# Patient Record
Sex: Female | Born: 1965 | Race: White | Hispanic: No | Marital: Married | Smoking: Never smoker
Health system: Southern US, Community
[De-identification: ages and names within clinical notes are randomized; demographics above are authoritative.]

## PROBLEM LIST (undated history)

## (undated) DIAGNOSIS — E785 Hyperlipidemia, unspecified: Secondary | ICD-10-CM

## (undated) DIAGNOSIS — M810 Age-related osteoporosis without current pathological fracture: Secondary | ICD-10-CM

---

## 1999-07-16 ENCOUNTER — Other Ambulatory Visit: Admission: RE | Admit: 1999-07-16 | Discharge: 1999-07-16 | Payer: Self-pay | Admitting: Obstetrics and Gynecology

## 2000-07-24 ENCOUNTER — Other Ambulatory Visit: Admission: RE | Admit: 2000-07-24 | Discharge: 2000-07-24 | Payer: Self-pay | Admitting: Obstetrics and Gynecology

## 2001-09-05 ENCOUNTER — Other Ambulatory Visit: Admission: RE | Admit: 2001-09-05 | Discharge: 2001-09-05 | Payer: Self-pay | Admitting: Obstetrics and Gynecology

## 2003-01-08 ENCOUNTER — Other Ambulatory Visit: Admission: RE | Admit: 2003-01-08 | Discharge: 2003-01-08 | Payer: Self-pay | Admitting: Obstetrics and Gynecology

## 2003-09-02 ENCOUNTER — Inpatient Hospital Stay (HOSPITAL_COMMUNITY): Admission: AD | Admit: 2003-09-02 | Discharge: 2003-09-04 | Payer: Self-pay | Admitting: Obstetrics and Gynecology

## 2003-09-05 ENCOUNTER — Encounter: Admission: RE | Admit: 2003-09-05 | Discharge: 2003-10-05 | Payer: Self-pay | Admitting: Obstetrics and Gynecology

## 2003-10-05 ENCOUNTER — Encounter: Admission: RE | Admit: 2003-10-05 | Discharge: 2003-11-04 | Payer: Self-pay | Admitting: Obstetrics and Gynecology

## 2003-10-06 ENCOUNTER — Other Ambulatory Visit: Admission: RE | Admit: 2003-10-06 | Discharge: 2003-10-06 | Payer: Self-pay | Admitting: Obstetrics and Gynecology

## 2005-08-10 ENCOUNTER — Other Ambulatory Visit: Admission: RE | Admit: 2005-08-10 | Discharge: 2005-08-10 | Payer: Self-pay | Admitting: Obstetrics and Gynecology

## 2006-03-28 ENCOUNTER — Other Ambulatory Visit: Admission: RE | Admit: 2006-03-28 | Discharge: 2006-03-28 | Payer: Self-pay | Admitting: Obstetrics and Gynecology

## 2009-01-12 LAB — CONVERTED CEMR LAB

## 2009-09-23 ENCOUNTER — Ambulatory Visit: Payer: Self-pay | Admitting: Family Medicine

## 2009-10-28 ENCOUNTER — Ambulatory Visit: Payer: Self-pay | Admitting: Family Medicine

## 2009-11-04 ENCOUNTER — Ambulatory Visit: Payer: Self-pay | Admitting: Family Medicine

## 2009-11-09 LAB — CONVERTED CEMR LAB
BUN: 15 mg/dL (ref 6–23)
CO2: 26 meq/L (ref 19–32)
Chloride: 105 meq/L (ref 96–112)
Cholesterol: 175 mg/dL (ref 0–200)
Creatinine, Ser: 0.8 mg/dL (ref 0.4–1.2)
Glucose, Bld: 93 mg/dL (ref 70–99)
LDL Cholesterol: 108 mg/dL — ABNORMAL HIGH (ref 0–99)
Potassium: 4.3 meq/L (ref 3.5–5.1)
Triglycerides: 39 mg/dL (ref 0.0–149.0)

## 2010-02-03 ENCOUNTER — Ambulatory Visit: Payer: Self-pay | Admitting: Family Medicine

## 2010-02-03 DIAGNOSIS — H60339 Swimmer's ear, unspecified ear: Secondary | ICD-10-CM

## 2010-11-22 ENCOUNTER — Ambulatory Visit: Payer: Self-pay | Admitting: Internal Medicine

## 2011-01-13 NOTE — Assessment & Plan Note (Signed)
Summary: ?SINUS INFECTION/CLE   Vital Signs:  Patient profile:   45 year old female Height:      63.25 inches Weight:      150.75 pounds BMI:     26.59 Temp:     98.3 degrees F oral Pulse rate:   64 / minute Pulse rhythm:   regular BP sitting:   102 / 60  (left arm) Cuff size:   regular  Vitals Entered By: Selena Batten Dance CMA Duncan Dull) (November 22, 2010 12:12 PM) CC: ? Sinus infection Comments Symptoms x1 week   History of Present Illness: CC: sinus infx?  2 wk h/o nose congestion, + HA with sinus pressure around eyes, ears clogged.  + sneezing.  Cough attributed to drainage.  feels PNDrip.  Has tried alleve, ibuprofen, mucinex, benadryl.  Has tried neti pot in AM which controlled sxs until this weekend, now not touching it.  Pressure worse with bending head forward.  + purulent nasal discharge.  No ST, RN, ear pain, tooth pain.  No abd pain, n/v/d, rashes, myalgias, arthralgias.  tends to get infections 2-3 x /year.   No sick contacts at home, no smokers at home.  no h/o asthma.  + h/o seasonal allergies usually in spring.  amoxicillin doesn't usually help with her sinus infections in past.   Current Medications (verified): 1)  Multivitamins   Tabs (Multiple Vitamin) .... About Once A Week  Allergies (verified): No Known Drug Allergies  Past History:  Past Medical History: Last updated: 09/23/2009 Cervical dysplasia  Social History: Last updated: 09/23/2009 Lives with husband and 3 children ( 6, 41, and 15 daughter named Surveyor, quantity) in [place].  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  Exercizes at least 4 days/week. Never Smoked Alcohol use-no Drug use-no  Review of Systems       per HPI  Physical Exam  General:  Well-developed,well-nourished,in no acute distress; alert,appropriate and cooperative throughout examination Head:  Normocephalic and atraumatic without obvious abnormalities. No apparent alopecia or balding. Eyes:  No corneal or conjunctival  inflammation noted. EOMI. Perrla.  Ears:  L ear normal and R ear normal.  slight more congestion L>R Nose:  nares injected. + discharge R turbinates Mouth:  Oral mucosa and oropharynx without lesions or exudates.  Teeth in good repair. MMM Neck:  No deformities, masses, or tenderness noted. Lungs:  Normal respiratory effort, chest expands symmetrically. Lungs are clear to auscultation, no crackles or wheezes. Heart:  Normal rate and regular rhythm. S1 and S2 normal without gallop, murmur, click, rub or other extra sounds. Pulses:  2+ rad pulses Extremities:  No clubbing, cyanosis, edema, or deformity noted with normal full range of motion of all joints.   Skin:  Intact without suspicious lesions or rashes   Impression & Recommendations:  Problem # 1:  SINUSITIS, ACUTE (ICD-461.9) Instructed on treatment. Call if symptoms persist or worsen.  see pt instructions.  pt to think about restarting intranasal steroid  Her updated medication list for this problem includes:    Augmentin 875-125 Mg Tabs (Amoxicillin-pot clavulanate) ..... One twice daily for 10 days  Complete Medication List: 1)  Multivitamins Tabs (Multiple vitamin) .... About once a week 2)  Augmentin 875-125 Mg Tabs (Amoxicillin-pot clavulanate) .... One twice daily for 10 days  Patient Instructions: 1)  You have a sinus infection. 2)  Take medicines as prescribed: augmentin twice daily for 10 days 3)  Take guaifenesin 400mg  IR 1 1/2 pills in am and at noon (or mucinex)  with plenty of fluid to help mobilize mucous. 4)  Use nasal saline spray or neti pot to help drainage of sinuses. 5)  If you start having fevers >101.5, trouble swallowing or breathing, or are worsening instead of improving as expected, you may need to be seen again. 6)  Good to see you today, call clinic with questions.  Prescriptions: AUGMENTIN 875-125 MG TABS (AMOXICILLIN-POT CLAVULANATE) one twice daily for 10 days  #20 x 0   Entered and Authorized by:    Eustaquio Boyden  MD   Signed by:   Eustaquio Boyden  MD on 11/22/2010   Method used:   [id number]       [address]       [place], [place]  [address]       Ph: [phone]       Fax: [phone]   RxID:   [id number]    Orders Added: 1)  Est. Patient Level III [41660]    Current Allergies (reviewed today): No known allergies

## 2011-01-13 NOTE — Assessment & Plan Note (Signed)
Summary: EAR/CLE   Vital Signs:  Patient profile:   45 year old female Height:      63.25 inches Weight:      147.50 pounds BMI:     26.02 Temp:     98.4 degrees F oral Pulse rate:   72 / minute Pulse rhythm:   regular BP sitting:   122 / 62  (left arm) Cuff size:   regular  Vitals Entered By: Delilah Shan CMA Duncan Dull) (February 03, 2010 3:01 PM) CC: Ear   History of Present Illness: 45 yo with 3 weeks or right ear irritation. Hurts a little and when she uses qtips, her qtip has been red. Sometimes a little itchy. No recent illnesses. Teaches water aerobics. Never had anything like this before.  Current Medications (verified): 1)  Multivitamins   Tabs (Multiple Vitamin) .... About Once A Week 2)  Ciprodex 0.3-0.1 % Susp (Ciprofloxacin-Dexamethasone) .... 4 Drops Into Affected Ear Twice Daily For 7 Days  Allergies (verified): No Known Drug Allergies  Review of Systems      See HPI General:  Denies chills and fever. ENT:  Complains of earache; denies ear discharge, nasal congestion, postnasal drainage, sinus pressure, and sore throat. Resp:  Denies cough.  Physical Exam  General:  Well-developed,well-nourished,in no acute distress; alert,appropriate and cooperative throughout examination Ears:  L ear normal and R canal inflamed.   Nose:  External nasal examination shows no deformity or inflammation. Nasal mucosa are pink and moist without lesions or exudates. Mouth:  Oral mucosa and oropharynx without lesions or exudates.  Teeth in good repair. Lungs:  Normal respiratory effort, chest expands symmetrically. Lungs are clear to auscultation, no crackles or wheezes. Heart:  Normal rate and regular rhythm. S1 and S2 normal without gallop, murmur, click, rub or other extra sounds. Psych:  normally interactive, good eye contact, not anxious appearing, and not depressed appearing.     Impression & Recommendations:  Problem # 1:  OTITIS EXTERNA, ACUTE  (ICD-380.12) Assessment New Ciprodex x 7 days. Advised to call office if no improvement in 5 days. Ibuprofen as needed for pain/inflammation. Her updated medication list for this problem includes:    Ciprodex 0.3-0.1 % Susp (Ciprofloxacin-dexamethasone) .Marland KitchenMarland KitchenMarland KitchenMarland Kitchen 4 drops into affected ear twice daily for 7 days  Complete Medication List: 1)  Multivitamins Tabs (Multiple vitamin) .... About once a week 2)  Ciprodex 0.3-0.1 % Susp (Ciprofloxacin-dexamethasone) .... 4 drops into affected ear twice daily for 7 days Prescriptions: CIPRODEX 0.3-0.1 % SUSP (CIPROFLOXACIN-DEXAMETHASONE) 4 drops into affected ear twice daily for 7 days  #1 x 0   Entered and Authorized by:   Ruthe Mannan MD   Signed by:   Ruthe Mannan MD on 02/03/2010   Method used:   [facility]        [phone][facility] (retail)       [address] [place], [place]  [address]       Ph: [phone] or [phone]       Fax: [phone]   RxID:   [id number]   Current Allergies (reviewed today): No known allergies

## 2018-03-15 ENCOUNTER — Telehealth: Payer: Self-pay

## 2018-03-15 ENCOUNTER — Other Ambulatory Visit: Payer: Self-pay

## 2018-03-15 DIAGNOSIS — Z1211 Encounter for screening for malignant neoplasm of colon: Secondary | ICD-10-CM

## 2018-03-15 NOTE — Telephone Encounter (Signed)
Gastroenterology Pre-Procedure Review  Request Date:  Requesting Physician: Dr.   PATIENT REVIEW QUESTIONS: The patient responded to the following health history questions as indicated:    1. Are you having any GI issues? no 2. Do you have a personal history of Polyps? no 3. Do you have a family history of Colon Cancer or Polyps? no 4. Diabetes Mellitus? no 5. Joint replacements in the past 12 months?no 6. Major health problems in the past 3 months?no 7. Any artificial heart valves, MVP, or defibrillator?no    MEDICATIONS & ALLERGIES:    Patient reports the following regarding taking any anticoagulation/antiplatelet therapy:   Plavix, Coumadin, Eliquis, Xarelto, Lovenox, Pradaxa, Brilinta, or Effient? no Aspirin? no  Patient confirms/reports the following medications:  No current outpatient medications on file.   No current facility-administered medications for this visit.     Patient confirms/reports the following allergies:  Allergies not on file  No orders of the defined types were placed in this encounter.   AUTHORIZATION INFORMATION Primary Insurance: 1D#: Group #:  Secondary Insurance: 1D#: Group #:  SCHEDULE INFORMATION: Date: 04/06/18 Time: Location: [facility]

## 2018-04-03 ENCOUNTER — Telehealth: Payer: Self-pay | Admitting: Gastroenterology

## 2018-04-03 NOTE — Telephone Encounter (Signed)
Pt left vm to cancel her procedure

## 2018-04-06 ENCOUNTER — Ambulatory Visit
Admission: RE | Admit: 2018-04-06 | Payer: BLUE CROSS/BLUE SHIELD | Source: Ambulatory Visit | Admitting: Gastroenterology

## 2018-04-06 ENCOUNTER — Encounter: Admission: RE | Payer: Self-pay | Source: Ambulatory Visit

## 2018-04-06 SURGERY — COLONOSCOPY WITH PROPOFOL
Anesthesia: General

## 2018-05-28 ENCOUNTER — Other Ambulatory Visit: Payer: Self-pay

## 2018-05-28 DIAGNOSIS — Z1211 Encounter for screening for malignant neoplasm of colon: Secondary | ICD-10-CM

## 2018-05-30 ENCOUNTER — Other Ambulatory Visit: Payer: Self-pay

## 2018-06-05 ENCOUNTER — Ambulatory Visit: Payer: BLUE CROSS/BLUE SHIELD | Admitting: Certified Registered Nurse Anesthetist

## 2018-06-05 ENCOUNTER — Encounter
Admission: RE | Disposition: A | Discharge status: Home / Self Care | Payer: Self-pay | Source: Ambulatory Visit | Attending: Gastroenterology

## 2018-06-05 ENCOUNTER — Encounter: Payer: Self-pay | Admitting: *Deleted

## 2018-06-05 ENCOUNTER — Ambulatory Visit
Admission: RE | Admit: 2018-06-05 | Discharge: 2018-06-05 | Disposition: A | Discharge status: Home / Self Care | Payer: BLUE CROSS/BLUE SHIELD | Source: Ambulatory Visit | Attending: Gastroenterology | Admitting: Gastroenterology

## 2018-06-05 DIAGNOSIS — K635 Polyp of colon: Secondary | ICD-10-CM | POA: Insufficient documentation

## 2018-06-05 DIAGNOSIS — Z1211 Encounter for screening for malignant neoplasm of colon: Secondary | ICD-10-CM | POA: Insufficient documentation

## 2018-06-05 HISTORY — PX: COLONOSCOPY WITH PROPOFOL: SHX5780

## 2018-06-05 SURGERY — COLONOSCOPY WITH PROPOFOL
Anesthesia: General

## 2018-06-05 MED ORDER — SODIUM CHLORIDE 0.9 % IV SOLN
INTRAVENOUS | Status: DC
  Administered 2018-06-05: 1000 mL via INTRAVENOUS

## 2018-06-05 MED ORDER — SODIUM CHLORIDE 0.9 % IV SOLN: INTRAVENOUS | Status: DC

## 2018-06-05 MED ORDER — PROPOFOL 10 MG/ML IV BOLUS
INTRAVENOUS | Status: DC | PRN
  Administered 2018-06-05: 50 mg via INTRAVENOUS

## 2018-06-05 MED ORDER — PHENYLEPHRINE HCL 10 MG/ML IJ SOLN
INTRAMUSCULAR | Status: DC | PRN
  Administered 2018-06-05: 100 ug via INTRAVENOUS

## 2018-06-05 MED ORDER — LIDOCAINE HCL (CARDIAC) PF 100 MG/5ML IV SOSY
PREFILLED_SYRINGE | INTRAVENOUS | Status: DC | PRN
  Administered 2018-06-05: 60 mg via INTRAVENOUS

## 2018-06-05 MED ORDER — GLYCOPYRROLATE 0.2 MG/ML IJ SOLN
INTRAMUSCULAR | Status: DC | PRN
  Administered 2018-06-05: 0.2 mg via INTRAVENOUS

## 2018-06-05 MED ORDER — PROPOFOL 500 MG/50ML IV EMUL
INTRAVENOUS | Status: DC | PRN
  Administered 2018-06-05: 125 ug/kg/min via INTRAVENOUS

## 2018-06-05 NOTE — Op Note (Signed)
[facility] Gastroenterology Patient Name: Sherry Bradshaw Procedure Date: 06/05/2018 1:49 PM MRN: [id number] Account #: [id number] Date of Birth: 1966-05-04 Admit Type: Outpatient Age: 52 Room: [facility] ENDO ROOM 4 Gender: Female Note Status: Finalized Procedure:            Colonoscopy Indications:          Screening for colorectal malignant neoplasm, This is                        the patient's first colonoscopy Providers:            Lin Landsman MD, MD Referring MD:         Monia Sabal. Corinna Capra MD, MD (Referring MD) Medicines:            Monitored Anesthesia Care Complications:        No immediate complications. Estimated blood loss: None. Procedure:            Pre-Anesthesia Assessment:                       - Prior to the procedure, a History and Physical was                        performed, and patient medications and allergies were                        reviewed. The patient is competent. The risks and                        benefits of the procedure and the sedation options and                        risks were discussed with the patient. All questions                        were answered and informed consent was obtained.                        Patient identification and proposed procedure were                        verified by the physician, the nurse, the                        anesthesiologist, the anesthetist and the technician in                        the pre-procedure area in the procedure room in the                        endoscopy suite. Mental Status Examination: alert and                        oriented. Airway Examination: normal oropharyngeal                        airway and neck mobility. Respiratory Examination:                        clear to auscultation. CV Examination: normal.  Prophylactic Antibiotics: The patient does not require                        prophylactic antibiotics. Prior Anticoagulants: The                 patient has taken no previous anticoagulant or                        antiplatelet agents. ASA Grade Assessment: I - A                        normal, healthy patient. After reviewing the risks and                        benefits, the patient was deemed in satisfactory                        condition to undergo the procedure. The anesthesia plan                        was to use monitored anesthesia care (MAC). Immediately                        prior to administration of medications, the patient was                        re-assessed for adequacy to receive sedatives. The                        heart rate, respiratory rate, oxygen saturations, blood                        pressure, adequacy of pulmonary ventilation, and                        response to care were monitored throughout the                        procedure. The physical status of the patient was                        re-assessed after the procedure.                       After obtaining informed consent, the colonoscope was                        passed under direct vision. Throughout the procedure,                        the patient's blood pressure, pulse, and oxygen                        saturations were monitored continuously. The                        Colonoscope was introduced through the anus and                        advanced to the the cecum, identified by appendiceal  orifice and ileocecal valve. The colonoscopy was                        performed without difficulty. The patient tolerated the                        procedure well. The quality of the bowel preparation                        was evaluated using the BBPS [facility] Bowel Preparation                        Scale) with scores of: Right Colon = 3, Transverse                        Colon = 3 and Left Colon = 3 (entire mucosa seen well                        with no residual staining, small fragments of stool or                         opaque liquid). The total BBPS score equals 9. Findings:      The perianal and digital rectal examinations were normal. Pertinent       negatives include normal sphincter tone and no palpable rectal lesions.      The colon (entire examined portion) appeared normal.      The retroflexed view of the distal rectum and anal verge was normal and       showed no anal or rectal abnormalities.      A diminutive polyp was found in the descending colon. The polyp was       sessile. The polyp was removed with a cold biopsy forceps. Resection and       retrieval were complete. Impression:           - The entire examined colon is normal.                       - The distal rectum and anal verge are normal on                        retroflexion view.                       - One diminutive polyp in the descending colon, removed                        with a cold biopsy forceps. Resected and retrieved. Recommendation:       - Discharge patient to home (with escort).                       - Resume regular diet today.                       - Continue present medications.                       - Await pathology results.                       -  Repeat colonoscopy in 5-10 years for surveillance                        based on pathology results. Procedure Code(s):    --- Professional ---                       [phone], Colonoscopy, flexible; with biopsy, single or                        multiple Diagnosis Code(s):    --- Professional ---                       Z12.11, Encounter for screening for malignant neoplasm                        of colon                       D12.4, Benign neoplasm of descending colon CPT copyright 2017 American Medical Association. All rights reserved. The codes documented in this report are preliminary and upon coder review may  be revised to meet current compliance requirements. Dr. Ulyess Mort Lin Landsman MD, MD 06/05/2018 2:06:55 PM This report has been  signed electronically. Number of Addenda: 0 Note Initiated On: 06/05/2018 1:49 PM Scope Withdrawal Time: 0 hours 7 minutes 3 seconds  Total Procedure Duration: 0 hours 9 minutes 50 seconds       [facility]

## 2018-06-05 NOTE — Anesthesia Post-op Follow-up Note (Signed)
Anesthesia QCDR form completed.        

## 2018-06-05 NOTE — Anesthesia Preprocedure Evaluation (Signed)
Anesthesia Evaluation  Patient identified by MRN, date of birth, ID band Patient awake    Reviewed: Allergy & Precautions, H&P , NPO status , Patient's Chart, lab work & pertinent test results  History of Anesthesia Complications Negative for: history of anesthetic complications  Airway Mallampati: III  TM Distance: >3 FB Neck ROM: full    Dental  (+) Chipped   Pulmonary neg pulmonary ROS, neg shortness of breath,           Cardiovascular Exercise Tolerance: Good (-) angina(-) Past MI and (-) DOE negative cardio ROS       Neuro/Psych negative neurological ROS  negative psych ROS   GI/Hepatic negative GI ROS, Neg liver ROS, neg GERD  ,  Endo/Other  negative endocrine ROS  Renal/GU negative Renal ROS  negative genitourinary   Musculoskeletal   Abdominal   Peds  Hematology negative hematology ROS (+)   Anesthesia Other Findings History reviewed. No pertinent past medical history.  History reviewed. No pertinent surgical history.  BMI    Body Mass Index:  27.46 kg/m      Reproductive/Obstetrics negative OB ROS                             Anesthesia Physical Anesthesia Plan  ASA: I  Anesthesia Plan: General   Post-op Pain Management:    Induction: Intravenous  PONV Risk Score and Plan: Propofol infusion and TIVA  Airway Management Planned: Natural Airway and Nasal Cannula  Additional Equipment:   Intra-op Plan:   Post-operative Plan:   Informed Consent: I have reviewed the patients History and Physical, chart, labs and discussed the procedure including the risks, benefits and alternatives for the proposed anesthesia with the patient or authorized representative who has indicated his/her understanding and acceptance.   Dental Advisory Given  Plan Discussed with: Anesthesiologist, CRNA and Surgeon  Anesthesia Plan Comments: (Patient consented for risks of anesthesia  including but not limited to:  - adverse reactions to medications - risk of intubation if required - damage to teeth, lips or other oral mucosa - sore throat or hoarseness - Damage to heart, brain, lungs or loss of life  Patient voiced understanding.)        Anesthesia Quick Evaluation

## 2018-06-05 NOTE — H&P (Signed)
  Arlyss Repressohini R Vanga, MD [address]  [address]  [place], [place] [address]  Main: [phone]  Fax: [phone] Pager: [phone]  Primary Care Physician:  Candice CampLowe, David, MD Primary Gastroenterologist:  Dr. Arlyss Repressohini R Vanga  Pre-Procedure History & Physical: HPI:  Sherry Bradshaw is a 52 y.o. female is here for an colonoscopy.   History reviewed. No pertinent past medical history.  History reviewed. No pertinent surgical history.  Prior to Admission medications   Not on File    Allergies as of 05/28/2018  . (Not on File)    History reviewed. No pertinent family history.  Social History   Socioeconomic History  . Marital status: Married    Spouse name: Not on file  . Number of children: Not on file  . Years of education: Not on file  . Highest education level: Not on file  Occupational History  . Not on file  Social Needs  . Financial resource strain: Not on file  . Food insecurity:    Worry: Not on file    Inability: Not on file  . Transportation needs:    Medical: Not on file    Non-medical: Not on file  Tobacco Use  . Smoking status: Never Smoker  . Smokeless tobacco: Never Used  Substance and Sexual Activity  . Alcohol use: Yes    Alcohol/week: 1.8 - 2.4 oz    Types: 3 - 4 Glasses of wine per week  . Drug use: Never  . Sexual activity: Not on file  Lifestyle  . Physical activity:    Days per week: Not on file    Minutes per session: Not on file  . Stress: Not on file  Relationships  . Social connections:    Talks on phone: Not on file    Gets together: Not on file    Attends religious service: Not on file    Active member of club or organization: Not on file    Attends meetings of clubs or organizations: Not on file    Relationship status: Not on file  . Intimate partner violence:    Fear of current or ex partner: Not on file    Emotionally abused: Not on file    Physically abused: Not on file    Forced sexual activity: Not on file    Other Topics Concern  . Not on file  Social History Narrative  . Not on file    Review of Systems: See HPI, otherwise negative ROS  Physical Exam: BP 117/80   Pulse 72   Temp (!) 96.7 F (35.9 C) (Tympanic)   Resp 18   Ht 5\' 4"  (1.626 m)   Wt 160 lb (72.6 kg)   SpO2 100%   BMI 27.46 kg/m  General:   Alert,  pleasant and cooperative in NAD Head:  Normocephalic and atraumatic. Neck:  Supple; no masses or thyromegaly. Lungs:  Clear throughout to auscultation.    Heart:  Regular rate and rhythm. Abdomen:  Soft, nontender and nondistended. Normal bowel sounds, without guarding, and without rebound.   Neurologic:  Alert and  oriented x4;  grossly normal neurologically.  Impression/Plan: Jullisa K Heng is here for an colonoscopy to be performed for colon cancer screening  Risks, benefits, limitations, and alternatives regarding  colonoscopy have been reviewed with the patient.  Questions have been answered.  All parties agreeable.   Lannette Donathohini Vanga, MD  06/05/2018, 1:36 PM

## 2018-06-05 NOTE — Transfer of Care (Signed)
Immediate Anesthesia Transfer of Care Note  Patient: Sherry Bradshaw  Procedure(s) Performed: COLONOSCOPY WITH PROPOFOL (N/A )  Patient Location: PACU and Endoscopy Unit  Anesthesia Type:General  Level of Consciousness: awake and drowsy  Airway & Oxygen Therapy: Patient Spontanous Breathing  Post-op Assessment: Report given to RN and Post -op Vital signs reviewed and stable  Post vital signs: Reviewed and stable  Last Vitals:  Vitals Value Taken Time  BP 107/93   Temp 37.1   Pulse 64   Resp 12   SpO2 96     Last Pain:  Vitals:   06/05/18 1328  TempSrc: Tympanic      Patients Stated Pain Goal: 0 (06/05/18 1328)  Complications: No apparent anesthesia complications

## 2018-06-06 NOTE — Anesthesia Postprocedure Evaluation (Signed)
Anesthesia Post Note  Patient: Sherry Bradshaw  Procedure(s) Performed: COLONOSCOPY WITH PROPOFOL (N/A )  Patient location during evaluation: Endoscopy Anesthesia Type: General Level of consciousness: awake and alert Pain management: pain level controlled Vital Signs Assessment: post-procedure vital signs reviewed and stable Respiratory status: spontaneous breathing, nonlabored ventilation, respiratory function stable and patient connected to nasal cannula oxygen Cardiovascular status: blood pressure returned to baseline and stable Postop Assessment: no apparent nausea or vomiting Anesthetic complications: no     Last Vitals:  Vitals:   06/05/18 1429 06/05/18 1435  BP: 102/69   Pulse: (!) 59 (!) 48  Resp: 13 16  Temp:    SpO2: 100% 100%    Last Pain:  Vitals:   06/06/18 0738  TempSrc:   PainSc: 0-No pain                 Cleda MccreedyJoseph K Hanifah Royse

## 2018-06-07 LAB — SURGICAL PATHOLOGY

## 2018-06-08 ENCOUNTER — Encounter: Payer: Self-pay | Admitting: Gastroenterology

## 2020-09-03 ENCOUNTER — Other Ambulatory Visit: Payer: BLUE CROSS/BLUE SHIELD

## 2020-09-03 DIAGNOSIS — Z20822 Contact with and (suspected) exposure to covid-19: Secondary | ICD-10-CM

## 2020-09-05 LAB — SARS-COV-2, NAA 2 DAY TAT

## 2020-09-05 LAB — NOVEL CORONAVIRUS, NAA: SARS-CoV-2, NAA: DETECTED — AB

## 2020-12-15 HISTORY — PX: TRANSTHORACIC ECHOCARDIOGRAM: SHX275

## 2021-12-12 HISTORY — PX: OTHER SURGICAL HISTORY: SHX169

## 2021-12-13 ENCOUNTER — Emergency Department: Payer: BC Managed Care – PPO

## 2021-12-13 ENCOUNTER — Encounter: Payer: Self-pay | Admitting: Emergency Medicine

## 2021-12-13 ENCOUNTER — Other Ambulatory Visit: Payer: Self-pay

## 2021-12-13 DIAGNOSIS — R079 Chest pain, unspecified: Secondary | ICD-10-CM | POA: Diagnosis not present

## 2021-12-13 DIAGNOSIS — R0989 Other specified symptoms and signs involving the circulatory and respiratory systems: Secondary | ICD-10-CM | POA: Insufficient documentation

## 2021-12-13 DIAGNOSIS — Z20822 Contact with and (suspected) exposure to covid-19: Secondary | ICD-10-CM | POA: Insufficient documentation

## 2021-12-13 DIAGNOSIS — R55 Syncope and collapse: Secondary | ICD-10-CM | POA: Insufficient documentation

## 2021-12-13 LAB — COMPREHENSIVE METABOLIC PANEL
ALT: 20 U/L (ref 0–44)
AST: 24 U/L (ref 15–41)
Albumin: 4.4 g/dL (ref 3.5–5.0)
Alkaline Phosphatase: 58 U/L (ref 38–126)
Anion gap: 8 (ref 5–15)
BUN: 13 mg/dL (ref 6–20)
CO2: 26 mmol/L (ref 22–32)
Calcium: 9.2 mg/dL (ref 8.9–10.3)
Chloride: 103 mmol/L (ref 98–111)
Creatinine, Ser: 0.75 mg/dL (ref 0.44–1.00)
GFR, Estimated: >60 mL/min (ref >60)
Glucose, Bld: 100 mg/dL — ABNORMAL HIGH (ref 70–99)
Potassium: 3.5 mmol/L (ref 3.5–5.1)
Sodium: 137 mmol/L (ref 135–145)
Total Bilirubin: 1 mg/dL (ref 0.3–1.2)
Total Protein: 6.9 g/dL (ref 6.5–8.1)

## 2021-12-13 LAB — CBC WITH DIFFERENTIAL/PLATELET
Abs Immature Granulocytes: 0.02 10*3/uL (ref 0.00–0.07)
Basophils Absolute: 0 10*3/uL (ref 0.0–0.1)
Basophils Relative: 1 %
Eosinophils Absolute: 0.1 10*3/uL (ref 0.0–0.5)
Eosinophils Relative: 1 %
HCT: 36.9 % (ref 36.0–46.0)
Hemoglobin: 12.6 g/dL (ref 12.0–15.0)
Immature Granulocytes: 0 %
Lymphocytes Relative: 16 %
Lymphs Abs: 1 10*3/uL (ref 0.7–4.0)
MCH: 31.3 pg (ref 26.0–34.0)
MCHC: 34.1 g/dL (ref 30.0–36.0)
MCV: 91.6 fL (ref 80.0–100.0)
Monocytes Absolute: 0.5 10*3/uL (ref 0.1–1.0)
Monocytes Relative: 7 %
Neutro Abs: 4.8 10*3/uL (ref 1.7–7.7)
Neutrophils Relative %: 75 %
Platelets: 233 10*3/uL (ref 150–400)
RBC: 4.03 MIL/uL (ref 3.87–5.11)
RDW: 12.3 % (ref 11.5–15.5)
WBC: 6.3 10*3/uL (ref 4.0–10.5)
nRBC: 0 % (ref 0.0–0.2)

## 2021-12-13 LAB — TROPONIN I (HIGH SENSITIVITY)
Troponin I (High Sensitivity): 3 ng/L (ref <18)
Troponin I (High Sensitivity): <2 ng/L (ref <18)

## 2021-12-13 NOTE — ED Provider Notes (Signed)
°  Emergency Medicine Provider Triage Evaluation Note  Sherry Bradshaw , a 56 y.o.female,  was evaluated in triage.  Pt complains of chest pain.  This is been going on intermittently for the past week.  Described as heaviness in the central part of her chest, with associated numbness in her hands and feet.  Previously seen by her primary care provider who did an EKG which was unremarkable.  She has an echo scheduled for next week.  No other symptoms at this time   Review of Systems  Positive: Chest pain Negative: Denies fever, abdominal pain vomiting  Physical Exam   Vitals:   12/13/21 1530  BP: 137/72  Pulse: 77  Resp: 20  Temp: 98 F (36.7 C)  SpO2: 98%   Gen:   Awake, no distress   Resp:  Normal effort  MSK:   Moves extremities without difficulty  Other:    Medical Decision Making  Given the patient's initial medical screening exam, the following diagnostic evaluation has been ordered. The patient will be placed in the appropriate treatment space, once one is available, to complete the evaluation and treatment. I have discussed the plan of care with the patient and I have advised the patient that an ED physician or mid-level practitioner will reevaluate their condition after the test results have been received, as the results may give them additional insight into the type of treatment they may need.    Diagnostics: Labs, EKG, CXR  Treatments: none immediately   Varney Daily, [place] 12/13/21 1533    Shaune Pollack, MD 12/13/21 1721

## 2021-12-13 NOTE — ED Triage Notes (Signed)
Pt via POV from home. Pt c/o dizziness, unbalanced and chest pressure states that it started today and happened also last week. Pt states that this started today. Pt is A&OX4 and NAD.

## 2021-12-14 ENCOUNTER — Observation Stay
Admission: EM | Admit: 2021-12-14 | Discharge: 2021-12-15 | Disposition: A | Discharge status: Home / Self Care | Payer: BC Managed Care – PPO | Attending: Emergency Medicine | Admitting: Emergency Medicine

## 2021-12-14 ENCOUNTER — Encounter: Payer: Self-pay | Admitting: Internal Medicine

## 2021-12-14 ENCOUNTER — Observation Stay: Payer: BC Managed Care – PPO

## 2021-12-14 DIAGNOSIS — R079 Chest pain, unspecified: Secondary | ICD-10-CM

## 2021-12-14 DIAGNOSIS — R55 Syncope and collapse: Secondary | ICD-10-CM

## 2021-12-14 DIAGNOSIS — R200 Anesthesia of skin: Secondary | ICD-10-CM

## 2021-12-14 DIAGNOSIS — E785 Hyperlipidemia, unspecified: Secondary | ICD-10-CM | POA: Diagnosis present

## 2021-12-14 DIAGNOSIS — R0789 Other chest pain: Secondary | ICD-10-CM

## 2021-12-14 DIAGNOSIS — R0989 Other specified symptoms and signs involving the circulatory and respiratory systems: Secondary | ICD-10-CM

## 2021-12-14 HISTORY — DX: Age-related osteoporosis without current pathological fracture: M81.0

## 2021-12-14 HISTORY — DX: Hyperlipidemia, unspecified: E78.5

## 2021-12-14 LAB — TSH: TSH: 1.073 u[IU]/mL (ref 0.350–4.500)

## 2021-12-14 LAB — RESP PANEL BY RT-PCR (FLU A&B, COVID) ARPGX2
Influenza A by PCR: NEGATIVE
Influenza B by PCR: NEGATIVE
SARS Coronavirus 2 by RT PCR: NEGATIVE

## 2021-12-14 LAB — HEMOGLOBIN A1C
Hgb A1c MFr Bld: 5 % (ref 4.8–5.6)
Mean Plasma Glucose: 96.8 mg/dL

## 2021-12-14 LAB — D-DIMER, QUANTITATIVE: D-Dimer, Quant: <0.27 ug/mL-FEU (ref 0.00–0.50)

## 2021-12-14 LAB — T4, FREE: Free T4: 1 ng/dL (ref 0.61–1.12)

## 2021-12-14 MED ORDER — NITROGLYCERIN 0.4 MG SL SUBL: 0.4000 mg | SUBLINGUAL_TABLET | SUBLINGUAL | Status: DC | PRN

## 2021-12-14 MED ORDER — ONDANSETRON HCL 4 MG/2ML IJ SOLN: 4.0000 mg | Freq: Three times a day (TID) | INTRAMUSCULAR | Status: DC | PRN

## 2021-12-14 MED ORDER — ACETAMINOPHEN 325 MG PO TABS
650.0000 mg | ORAL_TABLET | Freq: Once | ORAL | Status: AC
Start: 2021-12-14 — End: 2021-12-14
  Administered 2021-12-14: 650 mg via ORAL
  Filled 2021-12-14: qty 2

## 2021-12-14 MED ORDER — ACETAMINOPHEN 325 MG PO TABS
650.0000 mg | ORAL_TABLET | Freq: Four times a day (QID) | ORAL | Status: DC | PRN
  Administered 2021-12-14 – 2021-12-15 (×2): 650 mg via ORAL
  Filled 2021-12-14 (×2): qty 2

## 2021-12-14 MED ORDER — NITROGLYCERIN 2 % TD OINT
0.5000 [in_us] | TOPICAL_OINTMENT | Freq: Once | TRANSDERMAL | Status: AC
  Administered 2021-12-14: 0.5 [in_us] via TOPICAL
  Filled 2021-12-14: qty 1

## 2021-12-14 MED ORDER — ASPIRIN 81 MG PO CHEW
324.0000 mg | CHEWABLE_TABLET | Freq: Once | ORAL | Status: AC
  Administered 2021-12-14: 324 mg via ORAL
  Filled 2021-12-14: qty 4

## 2021-12-14 MED ORDER — SODIUM CHLORIDE 0.9 % IV BOLUS
1000.0000 mL | Freq: Once | INTRAVENOUS | Status: AC
  Administered 2021-12-14: 1000 mL via INTRAVENOUS

## 2021-12-14 MED ORDER — ENOXAPARIN SODIUM 40 MG/0.4ML IJ SOSY
40.0000 mg | PREFILLED_SYRINGE | INTRAMUSCULAR | Status: DC
  Administered 2021-12-14: 40 mg via SUBCUTANEOUS
  Filled 2021-12-14: qty 0.4

## 2021-12-14 MED ORDER — ASPIRIN EC 81 MG PO TBEC
81.0000 mg | DELAYED_RELEASE_TABLET | Freq: Every day | ORAL | Status: DC
  Administered 2021-12-15: 10:00:00 81 mg via ORAL
  Filled 2021-12-14: qty 1

## 2021-12-14 NOTE — H&P (Addendum)
History and Physical    Nolynn Benac Slagel [id number] DOB: May 24, 1966 DOA: 12/14/2021  Referring MD/NP/PA:   PCP: Louretta Shorten, MD   Patient coming from:  The patient is coming from home.  At baseline, pt is independent for most of ADL.        Chief Complaint: chest pressure and near syncope  HPI: Sherry Bradshaw is a 56 y.o. female with medical history significant of HLD, osteoporosis, who presents with chest pressure and near syncope.  Patient states that she feels dizzy like she was unbalanced since last night.  She feels like she is going to pass out, but did not.  She also had a 1 episode of numbness in the right arm and right leg yesterday, which has resolved.  No unilateral numbness or tinglings in extremities, no facial droop or slurred speech.  She also has chest pressure, which is located in substernal area, mild to moderate, nonradiating.  No shortness of breath.  No cough, fever or chills.  Patient does not have nausea, vomiting, diarrhea or abdominal pain.  No symptoms of UTI. She state that she wears an apple watch and has been noted her heart rate fluctuating from 50s to 130s.  She states that she had similar symptoms last week, which has resolved spontaneously. Patient told her PCP and is scheduled for echocardiogram.    ED Course: pt was found to have WBC 6.3, troponin level 3, -->  <2.0, negative D-dimer, TSH normal 1.073, negative COVID PCR, electrolytes renal function okay, temperature normal, blood pressure 124/75, heart rate 77, RR 20, oxygen saturation 98% on room air.  Chest x-ray negative.  CT of head is negative for acute intracranial abnormalities.  Carotid Doppler is negative for significant carotid artery stenosis. Patient is placed on MedSurg bed for observation.   Review of Systems:   General: no fevers, chills, no body weight gain, fatigue HEENT: no blurry vision, hearing changes or sore throat Respiratory: no dyspnea, coughing, wheezing CV: has chest  pressure, no palpitations GI: no nausea, vomiting, abdominal pain, diarrhea, constipation GU: no dysuria, burning on urination, increased urinary frequency, hematuria  Ext: has trace leg edema Neuro: had right arm and leg numbness, no vision change or hearing loss Skin: no rash, no skin tear. MSK: No muscle spasm, no deformity, no limitation of range of movement in spin Heme: No easy bruising.  Travel history: No recent long distant travel.  Allergy: No Known Allergies  Past Medical History:  Diagnosis Date   HLD (hyperlipidemia)    Osteoporosis     Past Surgical History:  Procedure Laterality Date   COLONOSCOPY WITH PROPOFOL N/A 06/05/2018   Procedure: COLONOSCOPY WITH PROPOFOL;  Surgeon: Lin Landsman, MD;  Location: [facility] ENDOSCOPY;  Service: Gastroenterology;  Laterality: N/A;    Social History:  reports that she has never smoked. She has never used smokeless tobacco. She reports current alcohol use of about 3.0 - 4.0 standard drinks per week. She reports that she does not use drugs.  Family History:  Family History  Problem Relation Age of Onset   Valvular heart disease Mother    Heart disease Maternal Grandmother      Prior to Admission medications   Not on File    Physical Exam: Vitals:   12/13/21 1934 12/13/21 2249 12/14/21 0450 12/14/21 0942  BP: 128/79 128/82 124/75 124/70  Pulse: 71 66 70 78  Resp: 18 18 16 16   Temp: 98.1 F (36.7 C) 98.1 F (36.7 C)  TempSrc: Oral Oral    SpO2: 100% 98% 100% 100%  Weight:      Height:       General: Not in acute distress HEENT:       Eyes: PERRL, EOMI, no scleral icterus.       ENT: No discharge from the ears and nose, no pharynx injection, no tonsillar enlargement.        Neck: No JVD, no bruit, no mass felt. Heme: No neck lymph node enlargement. Cardiac: S1/S2, RRR, No murmurs, No gallops or rubs. Respiratory: No rales, wheezing, rhonchi or rubs. GI: Soft, nondistended, nontender, no rebound pain, no  organomegaly, BS present. GU: No hematuria Ext: has trace leg edema bilaterally. 1+DP/PT pulse bilaterally. Musculoskeletal: No joint deformities, No joint redness or warmth, no limitation of ROM in spin. Skin: No rashes.  Neuro: Alert, oriented X3, cranial nerves II-XII grossly intact, moves all extremities normally. Psych: Patient is not psychotic, no suicidal or hemocidal ideation.  Labs on Admission: I have personally reviewed following labs and imaging studies  CBC: Recent Labs  Lab 12/13/21 1529  WBC 6.3  NEUTROABS 4.8  HGB 12.6  HCT 36.9  MCV 91.6  PLT 0000000   Basic Metabolic Panel: Recent Labs  Lab 12/13/21 1529  NA 137  K 3.5  CL 103  CO2 26  GLUCOSE 100*  BUN 13  CREATININE 0.75  CALCIUM 9.2   GFR: Estimated Creatinine Clearance: 75.9 mL/min (by C-G formula based on SCr of 0.75 mg/dL). Liver Function Tests: Recent Labs  Lab 12/13/21 1529  AST 24  ALT 20  ALKPHOS 58  BILITOT 1.0  PROT 6.9  ALBUMIN 4.4   No results for input(s): LIPASE, AMYLASE in the last 168 hours. No results for input(s): AMMONIA in the last 168 hours. Coagulation Profile: No results for input(s): INR, PROTIME in the last 168 hours. Cardiac Enzymes: No results for input(s): CKTOTAL, CKMB, CKMBINDEX, TROPONINI in the last 168 hours. BNP (last 3 results) No results for input(s): PROBNP in the last 8760 hours. HbA1C: Recent Labs    12/13/21 1529  HGBA1C 5.0   CBG: No results for input(s): GLUCAP in the last 168 hours. Lipid Profile: No results for input(s): CHOL, HDL, LDLCALC, TRIG, CHOLHDL, LDLDIRECT in the last 72 hours. Thyroid Function Tests: Recent Labs    12/13/21 1816  TSH 1.073  FREET4 1.00   Anemia Panel: No results for input(s): VITAMINB12, FOLATE, FERRITIN, TIBC, IRON, RETICCTPCT in the last 72 hours. Urine analysis: No results found for: COLORURINE, APPEARANCEUR, LABSPEC, PHURINE, GLUCOSEU, HGBUR, BILIRUBINUR, KETONESUR, PROTEINUR, UROBILINOGEN, NITRITE,  LEUKOCYTESUR Sepsis Labs: @LABRCNTIP (procalcitonin:4,lacticidven:4) ) Recent Results (from the past 240 hour(s))  Resp Panel by RT-PCR (Flu A&B, Covid) Nasopharyngeal Swab     Status: None   Collection Time: 12/14/21  6:18 AM   Specimen: Nasopharyngeal Swab; Nasopharyngeal(NP) swabs in vial transport medium  Result Value Ref Range Status   SARS Coronavirus 2 by RT PCR NEGATIVE NEGATIVE Final    Comment: (NOTE) SARS-CoV-2 target nucleic acids are NOT DETECTED.  The SARS-CoV-2 RNA is generally detectable in upper respiratory specimens during the acute phase of infection. The lowest concentration of SARS-CoV-2 viral copies this assay can detect is 138 copies/mL. A negative result does not preclude SARS-Cov-2 infection and should not be used as the sole basis for treatment or other patient management decisions. A negative result may occur with  improper specimen collection/handling, submission of specimen other than nasopharyngeal swab, presence of viral mutation(s) within the areas targeted by  this assay, and inadequate number of viral copies(<138 copies/mL). A negative result must be combined with clinical observations, patient history, and epidemiological information. The expected result is Negative.  Fact Sheet for Patients:  EntrepreneurPulse.com.au  Fact Sheet for Healthcare Providers:  IncredibleEmployment.be  This test is no t yet approved or cleared by the [place] FDA and  has been authorized for detection and/or diagnosis of SARS-CoV-2 by FDA under an Emergency Use Authorization (EUA). This EUA will remain  in effect (meaning this test can be used) for the duration of the COVID-19 declaration under Section 564(b)(1) of the Act, 21 U.S.C.section 360bbb-3(b)(1), unless the authorization is terminated  or revoked sooner.       Influenza A by PCR NEGATIVE NEGATIVE Final   Influenza B by PCR NEGATIVE NEGATIVE Final    Comment:  (NOTE) The Xpert Xpress SARS-CoV-2/FLU/RSV plus assay is intended as an aid in the diagnosis of influenza from Nasopharyngeal swab specimens and should not be used as a sole basis for treatment. Nasal washings and aspirates are unacceptable for Xpert Xpress SARS-CoV-2/FLU/RSV testing.  Fact Sheet for Patients: EntrepreneurPulse.com.au  Fact Sheet for Healthcare Providers: IncredibleEmployment.be  This test is not yet approved or cleared by the [place] FDA and has been authorized for detection and/or diagnosis of SARS-CoV-2 by FDA under an Emergency Use Authorization (EUA). This EUA will remain in effect (meaning this test can be used) for the duration of the COVID-19 declaration under Section 564(b)(1) of the Act, 21 U.S.C. section 360bbb-3(b)(1), unless the authorization is terminated or revoked.  Performed at [facility], [address], [place], [place] [address]      Radiological Exams on Admission: DG Chest 1 View  Result Date: 12/13/2021 CLINICAL DATA:  Chest pain EXAM: CHEST  1 VIEW COMPARISON:  None. FINDINGS: The heart size and mediastinal contours are within normal limits. Both lungs are clear. The visualized skeletal structures are unremarkable. IMPRESSION: No active disease. Electronically Signed   By: Keane Police D.O.   On: 12/13/2021 16:18   CT Head Wo Contrast  Result Date: 12/14/2021 CLINICAL DATA:  Chest pain and dizziness. EXAM: CT HEAD WITHOUT CONTRAST TECHNIQUE: Contiguous axial images were obtained from the base of the skull through the vertex without intravenous contrast. COMPARISON:  None. FINDINGS: Brain: No evidence of acute infarction, hemorrhage, hydrocephalus, extra-axial collection or mass lesion/mass effect. Vascular: No hyperdense vessel or unexpected calcification. Skull: Normal. Negative for fracture or focal lesion. Sinuses/Orbits: No acute finding. Other: None. IMPRESSION: No acute intracranial  pathology. Electronically Signed   By: Virgina Norfolk M.D.   On: 12/14/2021 00:00   US Carotid Bilateral  Result Date: 12/14/2021 CLINICAL DATA:  55 year old female with history of syncope EXAM: BILATERAL CAROTID DUPLEX ULTRASOUND TECHNIQUE: Pearline Cables scale imaging, color Doppler and duplex ultrasound were performed of bilateral carotid and vertebral arteries in the neck. COMPARISON:  None. FINDINGS: Criteria: Quantification of carotid stenosis is based on velocity parameters that correlate the residual internal carotid diameter with NASCET-based stenosis levels, using the diameter of the distal internal carotid lumen as the denominator for stenosis measurement. The following velocity measurements were obtained: RIGHT ICA:  Systolic A999333 cm/sec, Diastolic 30 cm/sec CCA:  123456 cm/sec SYSTOLIC ICA/CCA RATIO:  1.2 ECA:  174 cm/sec LEFT ICA:  Systolic XX123456 cm/sec, Diastolic 21 cm/sec CCA:  123XX123 cm/sec SYSTOLIC ICA/CCA RATIO:  1.1 ECA:  93 cm/sec Right Brachial SBP: Not acquired Left Brachial SBP: Not acquired RIGHT CAROTID ARTERY: No significant calcified disease of the right  common carotid artery. Intermediate waveform maintained. Homogeneous plaque without significant calcifications at the right carotid bifurcation. Low resistance waveform of the right ICA. No significant tortuosity. RIGHT VERTEBRAL ARTERY: Antegrade flow with low resistance waveform. LEFT CAROTID ARTERY: No significant calcified disease of the left common carotid artery. Intermediate waveform maintained. Homogeneous plaque at the left carotid bifurcation without significant calcifications. Low resistance waveform of the left ICA. LEFT VERTEBRAL ARTERY:  Antegrade flow with low resistance waveform. IMPRESSION: Color duplex indicates minimal homogeneous plaque, with no hemodynamically significant stenosis by duplex criteria in the extracranial cerebrovascular circulation. Signed, Dulcy Fanny. Dellia Nims, RPVI [facility]  [facility] Electronically Signed   By: Corrie Mckusick D.O.   On: 12/14/2021 12:11     EKG: I have personally reviewed.  Sinus rhythm, QTC 475, RAD, right bundle blockade, early R wave progression  Assessment/Plan Principal Problem:   Near syncope Active Problems:   Chest pressure   HLD (hyperlipidemia)   Near syncope: Etiology is not clear.  Carotid Doppler negative.  Patient reports 1 episode of right arm and leg numbness.  We need to rule out stroke.  Patient reports heart rate 50-130 at home, but her heart rate is 77 which is normal currently.  EKG showed right bundle blockage with right axis deviation.  Will get 2D echo for further evaluation.   - Place on progressive unit for obs - Orthostatic vital signs  - MRI-brain - 2d echo - Neuro checks  - cardiac monitoring - IVF: 1L of NS  Chest pressure: Currently symptoms has resolved.  Initial troponin negative x2 -Get one more trop -ASA 324 mg, then 81 mg daily -check A1c, FLP -check UDS -f/u 2d echo -prn NTG  HLD (hyperlipidemia): Patient used to be on Zetia, but currently is not taking his medication. -f/u with PCP     DVT ppx: SQ Lovenox Code Status: Full code Family Communication:  Yes, patient's husband at bed side.   Disposition Plan:  Anticipate discharge back to previous environment Consults called:  none Admission status and Level of care: Telemetry Medical:  for obs     Status is: Observation  The patient remains OBS appropriate and will d/c before 2 midnights.         Date of Service 12/14/2021    Ivor Costa [facility]   If 7PM-7AM, please contact night-coverage [email or web address] 12/14/2021, 1:22 PM

## 2021-12-14 NOTE — ED Notes (Signed)
Sitting on side of bed  denies nay pain at present  monitor shows NS

## 2021-12-14 NOTE — ED Notes (Signed)
Pt leaving for imaging.

## 2021-12-14 NOTE — ED Provider Notes (Signed)
[facility] : [facility] Provider Note    Event Date/Time   First MD Initiated Contact with Patient 12/14/21 [phone]     (approximate)   History   Chest Pain   HPI  Sherry Bradshaw is a 56 y.o. female who presents to the ED from home with a chief complaint of chest pain and dizziness.  Patient denies cardiac history.  Reports 1 episode last week which she describes as chest heaviness, feeling dizzy like she was unbalanced and about to pass out.  Episode resolved by itself.  Patient told her PCP and is scheduled for echocardiogram.  Last night she was unable to sleep around 1 AM secondary to recurrence of central chest heaviness.  Denies associated diaphoresis, shortness of breath, nausea/vomiting.  She wears an apple watch and has been noted her heart rate fluctuating from 50s to 130s.  Patient's mother with a history of atrial fibrillation.  Chest heaviness also associated with near syncopal episode.  Patient denies recent trauma, travel or hormone use.  Denies fever, cough, abdominal pain, dysuria or diarrhea.     Past Medical History   Past Medical History:  Diagnosis Date   Osteoporosis      Active Problem List   Patient Active Problem List   Diagnosis Date Noted   Near syncope 12/14/2021   Encounter for screening colonoscopy    OTITIS EXTERNA, ACUTE 02/03/2010     Past Surgical History   Past Surgical History:  Procedure Laterality Date   COLONOSCOPY WITH PROPOFOL N/A 06/05/2018   Procedure: COLONOSCOPY WITH PROPOFOL;  Surgeon: Lin Landsman, MD;  Location: [facility] ENDOSCOPY;  Service: Gastroenterology;  Laterality: N/A;     Home Medications   Prior to Admission medications   Not on File     Allergies  Patient has no known allergies.   Family History  No family history on file.   Physical Exam  Triage Vital Signs: ED Triage Vitals  Enc Vitals Group     BP 12/13/21 1530 137/72     Pulse Rate 12/13/21 1530 77     Resp 12/13/21 1530  20     Temp 12/13/21 1530 98 F (36.7 C)     Temp Source 12/13/21 1530 Oral     SpO2 12/13/21 1530 98 %     Weight 12/13/21 1531 160 lb (72.6 kg)     Height 12/13/21 1531 5\' 3"  (1.6 m)     Head Circumference --      Peak Flow --      Pain Score 12/13/21 1530 3     Pain Loc --      Pain Edu? --      Excl. in [place]? --     Updated Vital Signs: BP 124/75 (BP Location: Left Arm)    Pulse 70    Temp 98.1 F (36.7 C) (Oral)    Resp 16    Ht 5\' 3"  (1.6 m)    Wt 72.6 kg    SpO2 100%    BMI 28.34 kg/m    General: Awake, no distress.  CV:  Good peripheral perfusion.  Resp:  Normal effort.  Abd:  No distention.  Neuro:  Alert and oriented x3.  CN II to XII grossly intact.  No focal motor or sensory deficits.  MAE x4. Other:  + Right carotid bruit.    ED Results / Procedures / Treatments  Labs (all labs ordered are listed, but only abnormal results are displayed) Labs Reviewed  COMPREHENSIVE  METABOLIC PANEL - Abnormal; Notable for the following components:      Result Value   Glucose, Bld 100 (*)    All other components within normal limits  RESP PANEL BY RT-PCR (FLU A&B, COVID) ARPGX2  CBC WITH DIFFERENTIAL/PLATELET  TSH  T4, FREE  TROPONIN I (HIGH SENSITIVITY)  TROPONIN I (HIGH SENSITIVITY)     EKG  ED ECG REPORT I, Keyonia Gluth J, the attending physician, personally viewed and interpreted this ECG.   Date: 12/14/2021  EKG Time: 1519  Rate: 69  Rhythm: normal sinus rhythm  Axis: Normal  Intervals:right bundle branch block  ST&T Change: Nonspecific    RADIOLOGY ED interpretation: I have personally reviewed patient's chest x-ray and CT head as well as radiology interpretation:  Chest x-ray demonstrates no acute cardiopulmonary process CT head demonstrates no ICH   Official radiology report(s): DG Chest 1 View  Result Date: 12/13/2021 CLINICAL DATA:  Chest pain EXAM: CHEST  1 VIEW COMPARISON:  None. FINDINGS: The heart size and mediastinal contours are within  normal limits. Both lungs are clear. The visualized skeletal structures are unremarkable. IMPRESSION: No active disease. Electronically Signed   By: Keane Police D.O.   On: 12/13/2021 16:18   CT Head Wo Contrast  Result Date: 12/14/2021 CLINICAL DATA:  Chest pain and dizziness. EXAM: CT HEAD WITHOUT CONTRAST TECHNIQUE: Contiguous axial images were obtained from the base of the skull through the vertex without intravenous contrast. COMPARISON:  None. FINDINGS: Brain: No evidence of acute infarction, hemorrhage, hydrocephalus, extra-axial collection or mass lesion/mass effect. Vascular: No hyperdense vessel or unexpected calcification. Skull: Normal. Negative for fracture or focal lesion. Sinuses/Orbits: No acute finding. Other: None. IMPRESSION: No acute intracranial pathology. Electronically Signed   By: Virgina Norfolk M.D.   On: 12/14/2021 00:00     PROCEDURES:  Critical Care performed: No  .1-3 Lead EKG Interpretation Performed by: Paulette Blanch, MD Authorized by: Paulette Blanch, MD     Interpretation: normal     ECG rate:  70   ECG rate assessment: normal     Rhythm: sinus rhythm     Ectopy: none     Conduction: normal   Comments:     Patient placed on cardiac monitor to evaluate for arrhythmias   MEDICATIONS ORDERED IN ED: Medications  nitroGLYCERIN (NITROGLYN) 2 % ointment 0.5 inch (has no administration in time range)  aspirin chewable tablet 324 mg (324 mg Oral Given 12/14/21 0612)     IMPRESSION / MDM / ASSESSMENT AND PLAN / ED COURSE  I reviewed the triage vital signs and the nursing notes.                              56 year old female presenting with chest pain and near syncope. Differential diagnosis includes, but is not limited to, ACS, aortic dissection, pulmonary embolism, cardiac tamponade, pneumothorax, pneumonia, pericarditis, myocarditis, GI-related causes including esophagitis/gastritis, and musculoskeletal chest wall pain.      The patient is on the cardiac  monitor to evaluate for evidence of arrhythmia and/or significant heart rate changes.  Laboratory results unremarkable; troponin negative.  I personally reviewed patient's records which are minimal; most recent notes are from colonoscopy dated 06/05/2018.  Patient's history of chest heaviness with associated near syncope/feeling unbalanced with clinical finding of carotid bruit and palpitations is concerning for cardiac causes.  Will administer aspirin, nitroglycerin paste.  Have discussed with hospitalist services for evaluation in the ED  for chest pain admission with carotid ultrasound, ECHO.      FINAL CLINICAL IMPRESSION(S) / ED DIAGNOSES   Final diagnoses:  Nonspecific chest pain  Near syncope  Bruit of right carotid artery     Rx / DC Orders   ED Discharge Orders     None        Note:  This document was prepared using Dragon voice recognition software and may include unintentional dictation errors.   Paulette Blanch, MD 12/14/21 [phone]

## 2021-12-15 ENCOUNTER — Observation Stay (HOSPITAL_BASED_OUTPATIENT_CLINIC_OR_DEPARTMENT_OTHER)
Admit: 2021-12-15 | Discharge: 2021-12-15 | Disposition: A | Discharge status: Home / Self Care | Payer: BC Managed Care – PPO | Attending: Internal Medicine | Admitting: Internal Medicine

## 2021-12-15 DIAGNOSIS — R55 Syncope and collapse: Secondary | ICD-10-CM

## 2021-12-15 DIAGNOSIS — R200 Anesthesia of skin: Secondary | ICD-10-CM

## 2021-12-15 DIAGNOSIS — R079 Chest pain, unspecified: Secondary | ICD-10-CM | POA: Diagnosis not present

## 2021-12-15 LAB — ECHOCARDIOGRAM COMPLETE
AR max vel: 2.2 cm2
AV Area VTI: 2.39 cm2
AV Area mean vel: 2.13 cm2
AV Mean grad: 4 mmHg
AV Peak grad: 7.1 mmHg
Ao pk vel: 1.33 m/s
Area-P 1/2: 3.7 cm2
MV VTI: 2.85 cm2
S' Lateral: 3.28 cm

## 2021-12-15 LAB — LIPID PANEL
Cholesterol: 188 mg/dL (ref 0–200)
HDL: 75 mg/dL (ref >40)
LDL Cholesterol: 103 mg/dL — ABNORMAL HIGH (ref 0–99)
Total CHOL/HDL Ratio: 2.5 RATIO
Triglycerides: 48 mg/dL (ref <150)
VLDL: 10 mg/dL (ref 0–40)

## 2021-12-15 LAB — TROPONIN I (HIGH SENSITIVITY): Troponin I (High Sensitivity): 4 ng/L (ref <18)

## 2021-12-15 LAB — HIV ANTIBODY (ROUTINE TESTING W REFLEX): HIV Screen 4th Generation wRfx: NONREACTIVE

## 2021-12-15 LAB — BRAIN NATRIURETIC PEPTIDE: B Natriuretic Peptide: 41.8 pg/mL (ref 0.0–100.0)

## 2021-12-15 NOTE — Progress Notes (Signed)
*  PRELIMINARY RESULTS* Echocardiogram 2D Echocardiogram has been performed.  Sherry Bradshaw 12/15/2021, 9:41 AM

## 2021-12-15 NOTE — Discharge Summary (Signed)
[facility] [place] NAME: Sherry Bradshaw    MR#:  [id number]  DATE OF BIRTH:  1966/09/18  DATE OF ADMISSION:  12/14/2021 ADMITTING PHYSICIAN: Ivor Costa, MD  DATE OF DISCHARGE: 12/15/2021  2:34 PM  PRIMARY CARE PHYSICIAN: Threasa Alpha   ADMISSION DIAGNOSIS:  Syncope [R55] Chest pressure [R07.89] Near syncope [R55] Nonspecific chest pain [R07.9] Bruit of right carotid artery [R09.89]  DISCHARGE DIAGNOSIS:  Principal Problem:   Near syncope Active Problems:   Chest pressure   HLD (hyperlipidemia)   SECONDARY DIAGNOSIS:   Past Medical History:  Diagnosis Date   HLD (hyperlipidemia)    Osteoporosis     HOSPITAL COURSE:   Near syncope, transient numbness right side, previous episode of chest pressure.  MRI of the brain negative, echocardiogram showed a normal EF, carotid ultrasound negative.  Cardiac enzymes negative.  D-dimer negative at 0.27 which makes blood clot unlikely.  Patient feeling better and wanted to follow-up with her PMD as outpatient.  Can consider event monitor because the patient did have episodes where her apple watch showed her heart rate will went from from 60 to 107.  Can consider outpatient stress test.  The patient did have a stressful holiday season.  The patient did not want any medications for panic attack at this time  DISCHARGE CONDITIONS:   Satisfactory  CONSULTS OBTAINED:  None  DRUG ALLERGIES:  No Known Allergies  DISCHARGE MEDICATIONS:   Allergies as of 12/15/2021   No Known Allergies      Medication List     STOP taking these medications    ezetimibe 10 MG tablet Commonly known as: ZETIA   norethindrone-ethinyl estradiol 1-5 MG-MCG Tabs tablet Commonly known as: FEMHRT 1/5       TAKE these medications    risedronate 150 MG tablet Commonly known as: ACTONEL Take 150 mg by mouth every 30 (thirty) days.         DISCHARGE INSTRUCTIONS:   Follow-up PMD 5 days  If you  experience worsening of your admission symptoms, develop shortness of breath, life threatening emergency, suicidal or homicidal thoughts you must seek medical attention immediately by calling 911 or calling your MD immediately  if symptoms less severe.  You Must read complete instructions/literature along with all the possible adverse reactions/side effects for all the Medicines you take and that have been prescribed to you. Take any new Medicines after you have completely understood and accept all the possible adverse reactions/side effects.   Please note  You were cared for by a hospitalist during your hospital stay. If you have any questions about your discharge medications or the care you received while you were in the hospital after you are discharged, you can call the unit and asked to speak with the hospitalist on call if the hospitalist that took care of you is not available. Once you are discharged, your primary care physician will handle any further medical issues. Please note that NO REFILLS for any discharge medications will be authorized once you are discharged, as it is imperative that you return to your primary care physician (or establish a relationship with a primary care physician if you do not have one) for your aftercare needs so that they can reassess your need for medications and monitor your lab values.    Today   CHIEF COMPLAINT:   Chief Complaint  Patient presents with   Chest Pain    HISTORY OF PRESENT ILLNESS:  Sherry Bradshaw  is a 56 y.o. female came in with near syncope, numbness on the right side and some chest pressure.   VITAL SIGNS:  Blood pressure 127/72, pulse 70, temperature 98.1 F (36.7 C), temperature source Oral, resp. rate 16, height 5\' 3"  (1.6 m), weight 72.6 kg, SpO2 100 %.  I/O:   Intake/Output Summary (Last 24 hours) at 12/15/2021 1634 Last data filed at 12/15/2021 1405 Gross per 24 hour  Intake 360 ml  Output --  Net 360 ml    PHYSICAL  EXAMINATION:  GENERAL:  56 y.o.-year-old patient lying in the bed with no acute distress.  EYES: Pupils equal, round, reactive to light and accommodation. No scleral icterus.  HEENT: Head atraumatic, normocephalic. Oropharynx and nasopharynx clear.  LUNGS: Normal breath sounds bilaterally, no wheezing, rales,rhonchi or crepitation. No use of accessory muscles of respiration.  CARDIOVASCULAR: S1, S2 normal. No murmurs, rubs, or gallops.  ABDOMEN: Soft, non-tender, non-distended.  EXTREMITIES: No pedal edema.  NEUROLOGIC: Cranial nerves II through XII are intact. Muscle strength 5/5 in all extremities. Sensation intact. Gait not checked.  PSYCHIATRIC: The patient is alert and oriented x 3.  SKIN: No obvious rash, lesion, or ulcer.   DATA REVIEW:   CBC Recent Labs  Lab 12/13/21 1529  WBC 6.3  HGB 12.6  HCT 36.9  PLT 233    Chemistries  Recent Labs  Lab 12/13/21 1529  NA 137  K 3.5  CL 103  CO2 26  GLUCOSE 100*  BUN 13  CREATININE 0.75  CALCIUM 9.2  AST 24  ALT 20  ALKPHOS 58  BILITOT 1.0     Microbiology Results  Results for orders placed or performed during the hospital encounter of 12/14/21  Resp Panel by RT-PCR (Flu A&B, Covid) Nasopharyngeal Swab     Status: None   Collection Time: 12/14/21  6:18 AM   Specimen: Nasopharyngeal Swab; Nasopharyngeal(NP) swabs in vial transport medium  Result Value Ref Range Status   SARS Coronavirus 2 by RT PCR NEGATIVE NEGATIVE Final    Comment: (NOTE) SARS-CoV-2 target nucleic acids are NOT DETECTED.  The SARS-CoV-2 RNA is generally detectable in upper respiratory specimens during the acute phase of infection. The lowest concentration of SARS-CoV-2 viral copies this assay can detect is 138 copies/mL. A negative result does not preclude SARS-Cov-2 infection and should not be used as the sole basis for treatment or other patient management decisions. A negative result may occur with  improper specimen collection/handling,  submission of specimen other than nasopharyngeal swab, presence of viral mutation(s) within the areas targeted by this assay, and inadequate number of viral copies(<138 copies/mL). A negative result must be combined with clinical observations, patient history, and epidemiological information. The expected result is Negative.  Fact Sheet for Patients:  EntrepreneurPulse.com.au  Fact Sheet for Healthcare Providers:  IncredibleEmployment.be  This test is no t yet approved or cleared by the [place] FDA and  has been authorized for detection and/or diagnosis of SARS-CoV-2 by FDA under an Emergency Use Authorization (EUA). This EUA will remain  in effect (meaning this test can be used) for the duration of the COVID-19 declaration under Section 564(b)(1) of the Act, 21 U.S.C.section 360bbb-3(b)(1), unless the authorization is terminated  or revoked sooner.       Influenza A by PCR NEGATIVE NEGATIVE Final   Influenza B by PCR NEGATIVE NEGATIVE Final    Comment: (NOTE) The Xpert Xpress SARS-CoV-2/FLU/RSV plus assay is intended as an aid in the diagnosis of influenza from Nasopharyngeal swab  specimens and should not be used as a sole basis for treatment. Nasal washings and aspirates are unacceptable for Xpert Xpress SARS-CoV-2/FLU/RSV testing.  Fact Sheet for Patients: EntrepreneurPulse.com.au  Fact Sheet for Healthcare Providers: IncredibleEmployment.be  This test is not yet approved or cleared by the [place] FDA and has been authorized for detection and/or diagnosis of SARS-CoV-2 by FDA under an Emergency Use Authorization (EUA). This EUA will remain in effect (meaning this test can be used) for the duration of the COVID-19 declaration under Section 564(b)(1) of the Act, 21 U.S.C. section 360bbb-3(b)(1), unless the authorization is terminated or revoked.  Performed at [facility], [address] [place], [place] [address]     RADIOLOGY:  CT Head Wo Contrast  Result Date: 12/14/2021 CLINICAL DATA:  Chest pain and dizziness. EXAM: CT HEAD WITHOUT CONTRAST TECHNIQUE: Contiguous axial images were obtained from the base of the skull through the vertex without intravenous contrast. COMPARISON:  None. FINDINGS: Brain: No evidence of acute infarction, hemorrhage, hydrocephalus, extra-axial collection or mass lesion/mass effect. Vascular: No hyperdense vessel or unexpected calcification. Skull: Normal. Negative for fracture or focal lesion. Sinuses/Orbits: No acute finding. Other: None. IMPRESSION: No acute intracranial pathology. Electronically Signed   By: Virgina Norfolk M.D.   On: 12/14/2021 00:00   MR BRAIN WO CONTRAST  Result Date: 12/14/2021 CLINICAL DATA:  Syncope/presyncope, cerebrovascular cause suspected EXAM: MRI HEAD WITHOUT CONTRAST TECHNIQUE: Multiplanar, multiecho pulse sequences of the brain and surrounding structures were obtained without intravenous contrast. COMPARISON:  CT head 12/13/2021 FINDINGS: Brain: No acute infarction, hemorrhage, hydrocephalus, extra-axial collection or mass lesion. Vascular: Major arterial flow voids are maintained. Skull and upper cervical spine: Normal marrow signal. Sinuses/Orbits: Clear sinuses.  Unremarkable orbits. Other: Small left mastoid effusion. IMPRESSION: Normal brain MRI.  No evidence of acute abnormality. Electronically Signed   By: Margaretha Sheffield M.D.   On: 12/14/2021 14:13   US Carotid Bilateral  Result Date: 12/14/2021 CLINICAL DATA:  56 year old female with history of syncope EXAM: BILATERAL CAROTID DUPLEX ULTRASOUND TECHNIQUE: Pearline Cables scale imaging, color Doppler and duplex ultrasound were performed of bilateral carotid and vertebral arteries in the neck. COMPARISON:  None. FINDINGS: Criteria: Quantification of carotid stenosis is based on velocity parameters that correlate the residual internal carotid diameter with  NASCET-based stenosis levels, using the diameter of the distal internal carotid lumen as the denominator for stenosis measurement. The following velocity measurements were obtained: RIGHT ICA:  Systolic A999333 cm/sec, Diastolic 30 cm/sec CCA:  123456 cm/sec SYSTOLIC ICA/CCA RATIO:  1.2 ECA:  174 cm/sec LEFT ICA:  Systolic XX123456 cm/sec, Diastolic 21 cm/sec CCA:  123XX123 cm/sec SYSTOLIC ICA/CCA RATIO:  1.1 ECA:  93 cm/sec Right Brachial SBP: Not acquired Left Brachial SBP: Not acquired RIGHT CAROTID ARTERY: No significant calcified disease of the right common carotid artery. Intermediate waveform maintained. Homogeneous plaque without significant calcifications at the right carotid bifurcation. Low resistance waveform of the right ICA. No significant tortuosity. RIGHT VERTEBRAL ARTERY: Antegrade flow with low resistance waveform. LEFT CAROTID ARTERY: No significant calcified disease of the left common carotid artery. Intermediate waveform maintained. Homogeneous plaque at the left carotid bifurcation without significant calcifications. Low resistance waveform of the left ICA. LEFT VERTEBRAL ARTERY:  Antegrade flow with low resistance waveform. IMPRESSION: Color duplex indicates minimal homogeneous plaque, with no hemodynamically significant stenosis by duplex criteria in the extracranial cerebrovascular circulation. Signed, Dulcy Fanny. Dellia Nims, [facility] Electronically Signed   By: Corrie Mckusick D.O.  On: 12/14/2021 12:11   ECHOCARDIOGRAM COMPLETE  Result Date: 12/15/2021    ECHOCARDIOGRAM REPORT   Patient Name:   DELAYLAH HOLLING Date of Exam: 12/15/2021 Medical Rec #:  [id number]           Height:       63.0 in Accession #:    [id number]          Weight:       160.0 lb Date of Birth:  1966/09/01            BSA:          1.759 m Patient Age:    42 years            BP:           129/72 mmHg Patient Gender: F                   HR:           73 bpm. Exam Location:   [facility] Procedure: 2D Echo, Color Doppler and Cardiac Doppler Indications:     R55 Syncope  History:         Patient has no prior history of Echocardiogram examinations.                  Risk Factors:Dyslipidemia.  Sonographer:     Charmayne Sheer Referring Phys:  [id number] Soledad Gerlach NIU Diagnosing Phys: Nelva Bush MD IMPRESSIONS  1. Left ventricular ejection fraction, by estimation, is 55 to 60%. The left ventricle has normal function. The left ventricle has no regional wall motion abnormalities. Left ventricular diastolic parameters were normal.  2. Right ventricular systolic function is normal. The right ventricular size is normal. Tricuspid regurgitation signal is inadequate for assessing PA pressure.  3. The mitral valve is normal in structure. No evidence of mitral valve regurgitation.  4. The aortic valve is tricuspid. Aortic valve regurgitation is not visualized. No aortic stenosis is present.  5. The inferior vena cava is normal in size with greater than 50% respiratory variability, suggesting right atrial pressure of 3 mmHg. FINDINGS  Left Ventricle: Left ventricular ejection fraction, by estimation, is 55 to 60%. The left ventricle has normal function. The left ventricle has no regional wall motion abnormalities. The left ventricular internal cavity size was normal in size. There is  borderline left ventricular hypertrophy. Left ventricular diastolic parameters were normal. Right Ventricle: The right ventricular size is normal. No increase in right ventricular wall thickness. Right ventricular systolic function is normal. Tricuspid regurgitation signal is inadequate for assessing PA pressure. Left Atrium: Left atrial size was normal in size. Right Atrium: Right atrial size was normal in size. Pericardium: There is no evidence of pericardial effusion. Mitral Valve: The mitral valve is normal in structure. Mild mitral annular calcification. No evidence of mitral valve regurgitation. MV peak gradient, 2.0 mmHg. The  mean mitral valve gradient is 1.0 mmHg. Tricuspid Valve: The tricuspid valve is normal in structure. Tricuspid valve regurgitation is trivial. Aortic Valve: The aortic valve is tricuspid. Aortic valve regurgitation is not visualized. No aortic stenosis is present. Aortic valve mean gradient measures 4.0 mmHg. Aortic valve peak gradient measures 7.1 mmHg. Aortic valve area, by VTI measures 2.39 cm. Pulmonic Valve: The pulmonic valve was normal in structure. Pulmonic valve regurgitation is not visualized. No evidence of pulmonic stenosis. Aorta: The aortic root and ascending aorta are structurally normal, with no evidence of dilitation. Pulmonary Artery: The pulmonary artery is of normal size. Venous: The  inferior vena cava is normal in size with greater than 50% respiratory variability, suggesting right atrial pressure of 3 mmHg. IAS/Shunts: No atrial level shunt detected by color flow Doppler.  LEFT VENTRICLE PLAX 2D LVIDd:         4.54 cm   Diastology LVIDs:         3.28 cm   LV e' medial:    8.27 cm/s LV PW:         1.02 cm   LV E/e' medial:  8.0 LV IVS:        0.96 cm   LV e' lateral:   12.30 cm/s LVOT diam:     2.00 cm   LV E/e' lateral: 5.4 LV SV:         63 LV SV Index:   36 LVOT Area:     3.14 cm  RIGHT VENTRICLE RV Basal diam:  2.88 cm LEFT ATRIUM           Index        RIGHT ATRIUM           Index LA diam:      3.50 cm 1.99 cm/m   RA Area:     13.40 cm LA Vol (A2C): 23.2 ml 13.19 ml/m  RA Volume:   29.20 ml  16.60 ml/m LA Vol (A4C): 36.0 ml 20.47 ml/m  AORTIC VALVE                    PULMONIC VALVE AV Area (Vmax):    2.20 cm     PV Vmax:       0.72 m/s AV Area (Vmean):   2.13 cm     PV Vmean:      51.700 cm/s AV Area (VTI):     2.39 cm     PV VTI:        0.130 m AV Vmax:           133.00 cm/s  PV Peak grad:  2.1 mmHg AV Vmean:          90.900 cm/s  PV Mean grad:  1.0 mmHg AV VTI:            0.262 m AV Peak Grad:      7.1 mmHg AV Mean Grad:      4.0 mmHg LVOT Vmax:         93.30 cm/s LVOT Vmean:         61.500 cm/s LVOT VTI:          0.199 m LVOT/AV VTI ratio: 0.76  AORTA Ao Root diam: 3.10 cm MITRAL VALVE MV Area (PHT): 3.70 cm    SHUNTS MV Area VTI:   2.85 cm    Systemic VTI:  0.20 m MV Peak grad:  2.0 mmHg    Systemic Diam: 2.00 cm MV Mean grad:  1.0 mmHg MV Vmax:       0.71 m/s MV Vmean:      43.5 cm/s MV Decel Time: 205 msec MV E velocity: 66.40 cm/s MV A velocity: 45.00 cm/s MV E/A ratio:  1.48 Harrell Gave End MD Electronically signed by Nelva Bush MD Signature Date/Time: 12/15/2021/1:36:37 PM    Final       Management plans discussed with the patient, family and they are in agreement.  CODE STATUS:     Code Status Orders  (From admission, onward)           Start     Ordered  12/14/21 0903  Full code  Continuous        12/14/21 0903           Code Status History     This patient has a current code status but no historical code status.       TOTAL TIME TAKING CARE OF THIS PATIENT: 35 minutes.    Loletha Grayer M.D on 12/15/2021 at 4:34 PM   [facility]  CC: Primary care physician; Threasa Alpha

## 2021-12-15 NOTE — Plan of Care (Signed)

## 2021-12-15 NOTE — Discharge Instructions (Signed)
Consider event monitor as outpatient

## 2021-12-15 NOTE — Care Management (Signed)
°  Transition of Care (TOC) Screening Note   Patient Details  Name: Zoeya Gramajo Lahaie Date of Birth: [birth year]   Transition of Care [facility]) CM/SW Contact:    Caryn Section, RN Phone Number: 12/15/2021, 2:09 PM    Transition of Care Department [facility]) has reviewed patient and no TOC needs have been identified at this time. We will continue to monitor patient advancement through interdisciplinary progression rounds. If new patient transition needs arise, please place a TOC consult.

## 2021-12-23 ENCOUNTER — Other Ambulatory Visit: Payer: Self-pay

## 2021-12-23 ENCOUNTER — Encounter: Payer: Self-pay | Admitting: Cardiology

## 2021-12-23 ENCOUNTER — Ambulatory Visit (INDEPENDENT_AMBULATORY_CARE_PROVIDER_SITE_OTHER): Payer: BC Managed Care – PPO | Admitting: Cardiology

## 2021-12-23 ENCOUNTER — Ambulatory Visit (INDEPENDENT_AMBULATORY_CARE_PROVIDER_SITE_OTHER): Payer: BC Managed Care – PPO

## 2021-12-23 VITALS — BP 110/72 | HR 71 | Ht 63.0 in | Wt 158.4 lb

## 2021-12-23 DIAGNOSIS — R55 Syncope and collapse: Secondary | ICD-10-CM

## 2021-12-23 DIAGNOSIS — R072 Precordial pain: Secondary | ICD-10-CM

## 2021-12-23 DIAGNOSIS — R002 Palpitations: Secondary | ICD-10-CM | POA: Insufficient documentation

## 2021-12-23 DIAGNOSIS — M79662 Pain in left lower leg: Secondary | ICD-10-CM

## 2021-12-23 DIAGNOSIS — E782 Mixed hyperlipidemia: Secondary | ICD-10-CM

## 2021-12-23 DIAGNOSIS — R0789 Other chest pain: Secondary | ICD-10-CM | POA: Diagnosis not present

## 2021-12-23 DIAGNOSIS — M79661 Pain in right lower leg: Secondary | ICD-10-CM

## 2021-12-23 NOTE — Progress Notes (Signed)
Primary Care Provider: Louretta Shorten, MD Cardiologist: None Electrophysiologist: None  Clinic Note: Chief Complaint  Patient presents with   New Patient (Initial Visit)    Ref by Threasa Alpha, PA for chest pain. Patient c/o chest pain, dizziness, right arm numbness as well as right calf pain/numbness/tingling. Medications reviewed by the patient verbally.    Near Syncope   Chest Pain   Right leg pain   ===================================  ASSESSMENT/PLAN   Problem List Items Addressed This Visit       Cardiology Problems   Near syncope - Primary (Chronic)    Hospital work-up was pretty benign.  Normal echo, normal carotid Dopplers.  Normal brain MRI.  I really think the episode sounds like she had some type of vagal event and was probably not adequately hydrated.  She had had several cups of coffee and had not really had anything else to drink a day.  Plan: Continue to encourage hydration. 14-day Zio patch monitor With chest pain involvement, ischemic eval with Coronary CTA      Relevant Medications   metoprolol succinate (TOPROL-XL) 25 MG 24 hr tablet   nitroGLYCERIN (NITROSTAT) 0.3 MG SL tablet   Other Relevant Orders   EKG 12-Lead   LONG TERM MONITOR (3-14 DAYS)   HLD (hyperlipidemia) (Chronic)    Lipids actually pretty good for some without significant CAD risk factors.  Risk stratification with coronary CTA      Relevant Medications   metoprolol succinate (TOPROL-XL) 25 MG 24 hr tablet   nitroGLYCERIN (NITROSTAT) 0.3 MG SL tablet     Other   Heart palpitations (Chronic)    She seems to be having some labile heart rates both up and down.  I do not think she is having true arrhythmias, but since she did have a near syncopal event, will need to assess.  Plan: 14-day Zio patch monitor      Relevant Orders   EKG 12-Lead   LONG TERM MONITOR (3-14 DAYS)   Chest pressure    Somewhat atypical symptoms although 2 episodes seem to be with mostly rest, did not  always get worse with exertion, but may have.  At this point I do think she needs an ischemic evaluation.  Would like to be definitive and GXT are not very definitive.  She does have hyperlipidemia.  Otherwise normal echo.  We do not need to have a reassessment of a cardiac function.  Class IIa indication for ischemic evaluation using Coronary CTA with possible FFRCT.       Bilateral calf pain (Chronic)    Bilateral calf discomfort but the right is worse than the left.  It really seems to be mostly the right leg and only from the knee down.  Venous duplex were negative for DVT.  Would like to evaluate for reflux since the pain really seem to happen when she was sitting down in the car and then again was sitting at home.  Plan: Lower extremity venous Dopplers to evaluate for reflux. Foot elevation Support stockings, especially with travel, prolonged sitting.      Relevant Orders   VAS [place] LOWER EXTREMITY VENOUS REFLUX   Other Visit Diagnoses     Precordial pain       Relevant Orders   CT CORONARY MORPH W/CTA COR W/SCORE W/CA W/CM &/OR WO/CM       ===================================  HPI:    Sherry Bradshaw is a 57 y.o. female with a PMH who is being seen today for the  evaluation of CHEST PAIN, and NEAR SYNCOPE at the request of Louretta Shorten, MD as a hospital follow-up (no note from PCP following ER visit available).  Recent Hospitalizations:  1/3-03/2022 -> [facility] - Syncope & Chest Pressure; r/o MI.  She noted transient right-sided numbness with some chest pressure.  She had a previous episode of chest pressure as well.  She noted numbness and pain down the right leg. MRI brain negative.  Carotid ultrasound normal.  Echo normal.  Negative D-dimer. Recommendations: Consider event monitor-patient noted heart rates ranging from 60 to 110 bpm, consider stress test-stressful holiday with some chest pain. Sx not thought to be c/w anxiety  Sherry Bradshaw went into [facility].  She was triaged.  Vital signs are stable.  Due to prolonged wait, she decided to leave prior to being seen.  Reviewed  CV studies:    The following studies were reviewed today: (if available, images/films reviewed: From Epic Chart or Care Everywhere) TTE 12/15/2020 : EF 55 to 60%.  Normal LV function.  No R WMA.  Normal RV size and function.  Unable to assess PAP.  Normal MV.  Normal AoV.  Normal RAP.Marland Kitchen   Interval History:   Sherry Bradshaw presents here today along with her husband.  They have multiple different complaints.  Somewhat complicated.  They are somewhat anxious about the symptoms she is been having.  They tended to talk on top of each other describing symptoms.  She decided she says that over the last few months, especially going into the holidays, things have been quite stressful. Initial symptoms began shortly after Christmas they were down at the beach in [place].  She woke up in the morning TAVR had a couple coffee was otherwise feeling fine.  Then she then got up to get a second And started feeling really lightheaded and woozy.  This was then followed by having some chest pressure which she felt a brick on her chest.  She could not breathe.  It gradually eased off, but was somewhat bothersome to her.  They would decide to go to the urgent care, but never got seen.  They decided to not drive home.  On the drive home she started having the chest pressure again and also having pain and numbness on the right side arm and leg with numbness in the leg and pressure.  This lasted all day.  It finally went away and she just felt tired and worn out for the next couple days.   She did feel some irregular heartbeat spells where her heart rate would go fast and then slow to fast again.  She felt forceful beats and skipping beats.  She will get short of breath.  This happened at random times.  A couple days later  after Lysle Morales, she again had an episode where she all of a sudden felt lightheaded with chest pressure and tightness.  Previously same symptoms just less severity.  She then again also had a right leg pain again.  He fell again now and achy.  When symptoms did not get better, she decided to go to the emergency room on the third.  She was having chest pressure and tightness that she said may or may not have gotten worse with exertion.  She really cannot tell.  Since the ER discharge, she still has had these episodes off and on and has been having this dull aching in  the chest off and on.  She said some mild end of day swelling but not significant.  No real PND orthopnea.  No claudication symptoms.  The right leg still bothers her off and on as well.  She says that she drinks closer to 64 ounce bottles of water a day.  CV Review of Symptoms (Summary) Cardiovascular ROS: positive for - chest pain, dyspnea on exertion, irregular heartbeat, palpitations, rapid heart rate, and -fatigue, right leg numbness and pain.  Lightheadedness dizziness with near syncope. negative for - loss of consciousness, orthopnea, paroxysmal nocturnal dyspnea, or TIA/amaurosis fugax, claudication  REVIEWED OF SYSTEMS   Review of Systems  Constitutional:  Positive for malaise/fatigue (Since initial onset of symptoms short after Christmas).  HENT:  Negative for congestion and nosebleeds.   Respiratory:  Positive for shortness of breath (Only when she really feels the chest pressure). Negative for cough.   Cardiovascular:        Per HPI  Gastrointestinal:  Positive for nausea. Negative for blood in stool, constipation, melena and vomiting.  Genitourinary:  Negative for dysuria and frequency.  Musculoskeletal:  Negative for joint pain.       Left leg aches mostly from the knee down.  Not really swollen, just tense and numb  Neurological:  Positive for dizziness, tingling (And numbness-right leg) and focal weakness (The  right leg feels weak). Negative for seizures and loss of consciousness.  Psychiatric/Behavioral:  Negative for depression. The patient is nervous/anxious and has insomnia (Has not been sleeping well since the episode began).   All other systems reviewed and are negative.  I have reviewed and (if needed) personally updated the patient's problem list, medications, allergies, past medical and surgical history, social and family history.   PAST MEDICAL HISTORY   Past Medical History:  Diagnosis Date   HLD (hyperlipidemia)    Osteoporosis     PAST SURGICAL HISTORY   Past Surgical History:  Procedure Laterality Date   COLONOSCOPY WITH PROPOFOL N/A 06/05/2018   Procedure: COLONOSCOPY WITH PROPOFOL;  Surgeon: Lin Landsman, MD;  Location: [facility] ENDOSCOPY;  Service: Gastroenterology;  Laterality: N/A;    Immunization History  Administered Date(s) Administered   Td 09/23/2009    MEDICATIONS/ALLERGIES   Current Meds  Medication Sig   risedronate (ACTONEL) 150 MG tablet Take 150 mg by mouth every 30 (thirty) days.    No Known Allergies  SOCIAL HISTORY/FAMILY HISTORY   Reviewed in Epic:  Pertinent findings:  Social History   Tobacco Use   Smoking status: Never   Smokeless tobacco: Never  Substance Use Topics   Alcohol use: Yes    Alcohol/week: 3.0 - 4.0 standard drinks    Types: 3 - 4 Glasses of wine per week   Drug use: Never   Social History   Social History Narrative   Not on file    OBJCTIVE -PE, EKG, labs   Wt Readings from Last 3 Encounters:  12/23/21 158 lb 6 oz (71.8 kg)  12/13/21 160 lb (72.6 kg)  06/05/18 160 lb (72.6 kg)    Physical Exam: BP 110/72 (BP Location: Right Arm, Patient Position: Sitting, Cuff Size: Normal)    Pulse 71    Ht 5\' 3"  (1.6 m)    Wt 158 lb 6 oz (71.8 kg)    SpO2 98%    BMI 28.05 kg/m  Physical Exam Vitals reviewed.  Constitutional:      General: She is not in acute distress.    Appearance: Normal appearance. She  is  normal weight. She is not ill-appearing or toxic-appearing.     Comments: Well-nourished, well-groomed.  Healthy-appearing.  Just very anxious.  HENT:     Head: Normocephalic and atraumatic.  Eyes:     Extraocular Movements: Extraocular movements intact.     Conjunctiva/sclera: Conjunctivae normal.     Pupils: Pupils are equal, round, and reactive to light.  Neck:     Vascular: No carotid bruit, hepatojugular reflux or JVD.  Cardiovascular:     Rate and Rhythm: Normal rate and regular rhythm. No extrasystoles are present.    Chest Wall: PMI is not displaced.     Pulses: Normal pulses.     Heart sounds: No murmur (Cannot exclude soft SEM.) heard.   No friction rub. No gallop.     Comments: Normal S1, split S2 Musculoskeletal:     Cervical back: Normal range of motion and neck supple.  Neurological:     Mental Status: She is alert.     Adult ECG Report  Rate: 67;  Rhythm: normal sinus rhythm and RBBB. ; Normal axis, intervals durations.  (Does not criteria for inferior MI, age-indeterminate.)  Narrative Interpretation: Stable.  Recent Labs: Reviewed Lab Results  Component Value Date   CHOL 188 12/15/2021   HDL 75 12/15/2021   LDLCALC 103 (H) 12/15/2021   TRIG 48 12/15/2021   CHOLHDL 2.5 12/15/2021   Lab Results  Component Value Date   CREATININE 0.75 12/13/2021   BUN 13 12/13/2021   NA 137 12/13/2021   K 3.5 12/13/2021   CL 103 12/13/2021   CO2 26 12/13/2021   CBC Latest Ref Rng & Units 12/13/2021  WBC 4.0 - 10.5 K/uL 6.3  Hemoglobin 12.0 - 15.0 g/dL 12.6  Hematocrit 36.0 - 46.0 % 36.9  Platelets 150 - 400 K/uL 233    Lab Results  Component Value Date   HGBA1C 5.0 12/13/2021   Lab Results  Component Value Date   TSH 1.073 12/13/2021    ==================================================  COVID-19 Education: The signs and symptoms of COVID-19 were discussed with the patient and how to seek care for testing (follow up with PCP or arrange E-visit).    I spent  a total of 22 minutes with the patient spent in direct patient consultation.  Additional time spent with chart review  / charting (studies, outside notes, etc): 16 min Total Time: 38 min  Current medicines are reviewed at length with the patient today.  (+/- concerns) none  This visit occurred during the SARS-CoV-2 public health emergency.  Safety protocols were in place, including screening questions prior to the visit, additional usage of staff PPE, and extensive cleaning of exam room while observing appropriate contact time as indicated for disinfecting solutions.  Notice: This dictation was prepared with Dragon dictation along with smart phrase technology. Any transcriptional errors that result from this process are unintentional and may not be corrected upon review.  Studies Ordered:  Orders Placed This Encounter  Procedures   CT CORONARY MORPH W/CTA COR W/SCORE W/CA W/CM &/OR WO/CM   LONG TERM MONITOR (3-14 DAYS)   EKG 12-Lead   VAS [place] LOWER EXTREMITY VENOUS REFLUX    Patient Instructions / Medication Changes & Studies & Tests Ordered   Patient Instructions  Medication Instructions:  Your physician recommends that you continue on your current medications as directed. Please refer to the Current Medication list given to you today.   *If you need a refill on your cardiac medications before your next appointment, please  call your pharmacy*   Lab Work:  Your provider has ordered lab work (BMP) to be drawn today before you leave the office   If you have labs (blood work) drawn today and your tests are completely normal, you will receive your results only by: MyChart Message (if you have MyChart) OR A paper copy in the mail If you have any lab test that is abnormal or we need to change your treatment, we will call you to review the results.   Testing/Procedures:  Your provider has ordered a lower extremity venous doppler with reflux to look at the blood flow in your legs. This  will be done in our office. There is no prep for this. Plan to be in our office 60-90 minutes.   2. Your cardiac CT is scheduled for 1/26 at 9:30 in the morning.  [facility] [address] [place], [place] [address] [phone]  For scheduling needs, including cancellations and rescheduling, please call [place], [phone].    3. Your physician has recommended that you wear a Zio monitor for 14 days, this will be mailed to your home. T   Follow-Up: At [facility], you and your health needs are our priority.  As part of our continuing mission to provide you with exceptional heart care, we have created designated Provider Care Teams.  These Care Teams include your primary Cardiologist (physician) and Advanced Practice Providers (APPs -  Physician Assistants and Nurse Practitioners) who all work together to provide you with the care you need, when you need it.  We recommend signing up for the patient portal called "MyChart".  Sign up information is provided on this After Visit Summary.  MyChart is used to connect with patients for Virtual Visits (Telemedicine).  Patients are able to view lab/test results, encounter notes, upcoming appointments, etc.  Non-urgent messages can be sent to your provider as well.   To learn more about what you can do with MyChart, go to [email or web address].    Your next appointment:   6-8 week(s)  The format for your next appointment:   In Person  Provider:   You may see Glenetta Hew, MD or one of the following Advanced Practice Providers on your designated Care Team:   Murray Hodgkins, NP Christell Faith, PA-C Cadence Kathlen Mody, PA-C :1}    Other Instructions N/A      Glenetta Hew, M.D., M.S. Interventional Cardiologist   Pager # [phone] Phone # [phone] [address] [place], [place] [address]   Thank you for choosing [facility] [place]!!

## 2021-12-23 NOTE — Patient Instructions (Signed)
Medication Instructions:  Your physician recommends that you continue on your current medications as directed. Please refer to the Current Medication list given to you today.   *If you need a refill on your cardiac medications before your next appointment, please call your pharmacy*   Lab Work:  Your provider has ordered lab work (BMP) to be drawn today before you leave the office   If you have labs (blood work) drawn today and your tests are completely normal, you will receive your results only by: MyChart Message (if you have MyChart) OR A paper copy in the mail If you have any lab test that is abnormal or we need to change your treatment, we will call you to review the results.   Testing/Procedures:  Your provider has ordered a lower extremity venous doppler with reflux to look at the blood flow in your legs. This will be done in our office. There is no prep for this. Plan to be in our office 60-90 minutes.   2. Your cardiac CT is scheduled for 1/26 at 9:30 in the morning.  [facility] [address] [place], [place] [address] [phone]  Please arrive 15 mins early for check-in and test prep.    Please follow these instructions carefully (unless otherwise directed):   On the Night Before the Test: Be sure to Drink plenty of water. Do not consume any caffeinated/decaffeinated beverages or chocolate 12 hours prior to your test.   On the Day of the Test: Drink plenty of water until 1 hour prior to the test. Do not eat any food 4 hours prior to the test. You may take your regular medications prior to the test.  Take metoprolol (Lopressor) two hours prior to test. FEMALES- please wear underwire-free bra if available, avoid dresses & tight clothing       After the Test: Drink plenty of water. After receiving IV contrast, you may experience a mild flushed feeling. This is normal. On occasion, you may experience a mild rash  up to 24 hours after the test. This is not dangerous. If this occurs, you can take Benadryl 25 mg and increase your fluid intake. If you experience trouble breathing, this can be serious. If it is severe call 911 IMMEDIATELY. If it is mild, please call our office. If you take any of these medications: Glipizide/Metformin, Avandament, Glucavance, please do not take 48 hours after completing test unless otherwise instructed.  Please allow 2-4 weeks for scheduling of routine cardiac CTs. Some insurance companies require a pre-authorization which may delay scheduling of this test.   For non-scheduling related questions, please contact the cardiac imaging nurse navigator should you have any questions/concerns: Marchia Bond, Cardiac Imaging Nurse Navigator Gordy Clement, Cardiac Imaging Nurse Navigator [facility] Direct Office Dial: [phone]   For scheduling needs, including cancellations and rescheduling, please call [place], [phone].    3. Your physician has recommended that you wear a Zio monitor for 14 days, this will be mailed to your home. This monitor is a medical device that records the hearts electrical activity. Doctors most often use these monitors to diagnose arrhythmias. Arrhythmias are problems with the speed or rhythm of the heartbeat. The monitor is a small device applied to your chest. You can wear one while you do your normal daily activities. While wearing this monitor if you have any symptoms to push the button and record what you felt. Once you have worn this monitor for  the period of time provider prescribed (Usually 14 days), you will return the monitor device in the postage paid box. Once it is returned they will download the data collected and provide [place] with a report which the provider will then review and we will call you with those results. Important tips:  Avoid showering during the first 24 hours of wearing the monitor. Avoid excessive  sweating to help maximize wear time. Do not submerge the device, no hot tubs, and no swimming pools. Keep any lotions or oils away from the patch. After 24 hours you may shower with the patch on. Take brief showers with your back facing the shower head.  Do not remove patch once it has been placed because that will interrupt data and decrease adhesive wear time. Push the button when you have any symptoms and write down what you were feeling. Once you have completed wearing your monitor, remove and place into box which has postage paid and place in your outgoing mailbox.  If for some reason you have misplaced your box then call our office and we can provide another box and/or mail it off for you.       Follow-Up: At [facility], you and your health needs are our priority.  As part of our continuing mission to provide you with exceptional heart care, we have created designated Provider Care Teams.  These Care Teams include your primary Cardiologist (physician) and Advanced Practice Providers (APPs -  Physician Assistants and Nurse Practitioners) who all work together to provide you with the care you need, when you need it.  We recommend signing up for the patient portal called "MyChart".  Sign up information is provided on this After Visit Summary.  MyChart is used to connect with patients for Virtual Visits (Telemedicine).  Patients are able to view lab/test results, encounter notes, upcoming appointments, etc.  Non-urgent messages can be sent to your provider as well.   To learn more about what you can do with MyChart, go to [email or web address].    Your next appointment:   6-8 week(s)  The format for your next appointment:   In Person  Provider:   You may see Glenetta Hew, MD or one of the following Advanced Practice Providers on your designated Care Team:   Murray Hodgkins, NP Christell Faith, PA-C Cadence Kathlen Mody, PA-C :1}    Other Instructions N/A

## 2021-12-25 ENCOUNTER — Encounter: Payer: Self-pay | Admitting: Cardiology

## 2021-12-25 NOTE — Assessment & Plan Note (Signed)
Somewhat atypical symptoms although 2 episodes seem to be with mostly rest, did not always get worse with exertion, but may have.  At this point I do think she needs an ischemic evaluation.  Would like to be definitive and GXT are not very definitive.  She does have hyperlipidemia.  Otherwise normal echo.  We do not need to have a reassessment of a cardiac function.  Class IIa indication for ischemic evaluation using Coronary CTA with possible FFRCT.

## 2021-12-25 NOTE — Assessment & Plan Note (Signed)
Lipids actually pretty good for some without significant CAD risk factors.  Risk stratification with coronary CTA

## 2021-12-25 NOTE — Assessment & Plan Note (Addendum)
Bilateral calf discomfort but the right is worse than the left.  It really seems to be mostly the right leg and only from the knee down.  Venous duplex were negative for DVT.  Would like to evaluate for reflux since the pain really seem to happen when she was sitting down in the car and then again was sitting at home.  Plan: Lower extremity venous Dopplers to evaluate for reflux.  Foot elevation  Support stockings, especially with travel, prolonged sitting.

## 2021-12-25 NOTE — Assessment & Plan Note (Signed)
Hospital work-up was pretty benign.  Normal echo, normal carotid Dopplers.  Normal brain MRI.  I really think the episode sounds like Sherry Bradshaw had some type of vagal event and was probably not adequately hydrated.  Sherry Bradshaw had had several cups of coffee and had not really had anything else to drink a day.  Plan:  Continue to encourage hydration.  14-day Zio patch monitor  With chest pain involvement, ischemic eval with Coronary CTA

## 2021-12-25 NOTE — Assessment & Plan Note (Signed)
She seems to be having some labile heart rates both up and down.  I do not think she is having true arrhythmias, but since she did have a near syncopal event, will need to assess.  Plan: 14-day Zio patch monitor

## 2021-12-26 DIAGNOSIS — R55 Syncope and collapse: Secondary | ICD-10-CM | POA: Diagnosis not present

## 2021-12-26 DIAGNOSIS — R002 Palpitations: Secondary | ICD-10-CM | POA: Diagnosis not present

## 2022-01-03 ENCOUNTER — Other Ambulatory Visit: Payer: Self-pay

## 2022-01-03 ENCOUNTER — Ambulatory Visit (HOSPITAL_COMMUNITY)
Admission: RE | Admit: 2022-01-03 | Discharge: 2022-01-03 | Disposition: A | Discharge status: Home / Self Care | Payer: BC Managed Care – PPO | Source: Ambulatory Visit | Attending: Cardiology | Admitting: Cardiology

## 2022-01-03 DIAGNOSIS — M79661 Pain in right lower leg: Secondary | ICD-10-CM | POA: Insufficient documentation

## 2022-01-03 DIAGNOSIS — M79662 Pain in left lower leg: Secondary | ICD-10-CM | POA: Diagnosis present

## 2022-01-04 ENCOUNTER — Telehealth (HOSPITAL_COMMUNITY): Payer: Self-pay | Admitting: Emergency Medicine

## 2022-01-04 NOTE — Telephone Encounter (Signed)
Reaching out to patient to offer assistance regarding upcoming cardiac imaging study; pt verbalizes understanding of appt date/time, parking situation and where to check in, pre-test NPO status and medications ordered, and verified current allergies; name and call back number provided for further questions should they arise Rockwell Alexandria RN Navigator Cardiac Imaging [facility] [phone] office [phone] cell  Patient taking 50mg  2 hr prior to scan

## 2022-01-06 ENCOUNTER — Other Ambulatory Visit: Payer: Self-pay

## 2022-01-06 ENCOUNTER — Ambulatory Visit
Admission: RE | Admit: 2022-01-06 | Discharge: 2022-01-06 | Disposition: A | Discharge status: Home / Self Care | Payer: BC Managed Care – PPO | Source: Ambulatory Visit | Attending: Cardiology | Admitting: Cardiology

## 2022-01-06 DIAGNOSIS — R072 Precordial pain: Secondary | ICD-10-CM | POA: Insufficient documentation

## 2022-01-06 HISTORY — PX: OTHER SURGICAL HISTORY: SHX169

## 2022-01-06 MED ORDER — NITROGLYCERIN 0.4 MG SL SUBL: 0.8000 mg | SUBLINGUAL_TABLET | Freq: Once | SUBLINGUAL | Status: DC

## 2022-01-06 MED ORDER — IOHEXOL 350 MG/ML SOLN
75.0000 mL | Freq: Once | INTRAVENOUS | Status: AC | PRN
  Administered 2022-01-06: 75 mL via INTRAVENOUS

## 2022-01-06 NOTE — Progress Notes (Signed)
Patient tolerated CT well. Gave a bottle of water to patient to drink. Vital signs stable encourage to drink water throughout day.Reasons explained and verbalized understanding. Ambulated steady gait.    

## 2022-02-10 ENCOUNTER — Ambulatory Visit (INDEPENDENT_AMBULATORY_CARE_PROVIDER_SITE_OTHER): Payer: BC Managed Care – PPO | Admitting: Cardiology

## 2022-02-10 ENCOUNTER — Encounter: Payer: Self-pay | Admitting: Cardiology

## 2022-02-10 ENCOUNTER — Other Ambulatory Visit: Payer: Self-pay

## 2022-02-10 VITALS — BP 108/62 | HR 58 | Ht 63.0 in | Wt 161.0 lb

## 2022-02-10 DIAGNOSIS — E782 Mixed hyperlipidemia: Secondary | ICD-10-CM

## 2022-02-10 DIAGNOSIS — R55 Syncope and collapse: Secondary | ICD-10-CM

## 2022-02-10 DIAGNOSIS — M79661 Pain in right lower leg: Secondary | ICD-10-CM | POA: Diagnosis not present

## 2022-02-10 DIAGNOSIS — M79662 Pain in left lower leg: Secondary | ICD-10-CM

## 2022-02-10 DIAGNOSIS — R002 Palpitations: Secondary | ICD-10-CM | POA: Diagnosis not present

## 2022-02-10 DIAGNOSIS — R079 Chest pain, unspecified: Secondary | ICD-10-CM

## 2022-02-10 NOTE — Assessment & Plan Note (Signed)
No real significant labile heart rate changes noted on monitor.  No tachycardia arrhythmia noted. ? ?I think she can also wean off the metoprolol.  She has not been taking it every day.  Can use simply as a as needed medication for 1 or 2 days if symptoms of palpitations occur.  If she takes more than 5 days, would need to wean off for 5 days 1/2 tablet daily. ?

## 2022-02-10 NOTE — Assessment & Plan Note (Addendum)
Hospital work-up and cardiac evaluation essentially benign.  Normal carotid arteries, normal echo and exquisitely normal Coronary CTA with no significant findings on monitor. ? ?I do think the episodes sound vasovagal in the setting of dehydration. ? ?Reassurance provided, continue to encourage adequate hydration. ?

## 2022-02-10 NOTE — Progress Notes (Signed)
Primary Care Provider: Armando Gang, FNP Cardiologist: None Electrophysiologist: None  Clinic Note: Chief Complaint  Patient presents with   Follow-up    Follow up to review test results. Medications verbally reviewed with patient.    Palpitations    Minimal symptoms now.  Monitor results reviewed   Near Syncope    No further episodes   Chest Pain    No further episodes, Coronary CTA results   ===================================  ASSESSMENT/PLAN   Problem List Items Addressed This Visit       Cardiology Problems   Near syncope - Primary (Chronic)    Hospital work-up and cardiac evaluation essentially benign.  Normal carotid arteries, normal echo and exquisitely normal Coronary CTA with no significant findings on monitor.  I do think the episodes sound vasovagal in the setting of dehydration.  Reassurance provided, continue to encourage adequate hydration.      Relevant Medications   ezetimibe (ZETIA) 10 MG tablet   HLD (hyperlipidemia) (Chronic)    Restratification Coronary CTA you shows that she is relatively low risk with a Coronary Calcium Score 0 and no evidence of CAD.  I think she is fine with her current level of lipids and does not necessarily need to be on statin.      Relevant Medications   ezetimibe (ZETIA) 10 MG tablet     Other   Heart palpitations (Chronic)    No real significant labile heart rate changes noted on monitor.  No tachycardia arrhythmia noted.  I think she can also wean off the metoprolol.  She has not been taking it every day.  Can use simply as a as needed medication for 1 or 2 days if symptoms of palpitations occur.  If she takes more than 5 days, would need to wean off for 5 days 1/2 tablet daily.      Chest pain with moderate risk for cardiac etiology (Chronic)    Coronary CTA essentially normal.  Calcium score 0 with no evidence of CAD noted.  Not likely to be cardiac etiology for chest pain.  Suspect that it was probably  anxiety driven.        Bilateral calf pain (Chronic)    Mostly right sided leg numbness.  Not likely related to venous stasis answered Dopplers did not show any significant venous stasis.  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  Not consistent with claudication or stasis related neuropathy.      ===================================  HPI:    Eleasha K Poynor is a 56 y.o. female with a PMH who is being seen today for the follow-up evaluation of CHEST PAIN, and NEAR SYNCOPE, PALPITATIONS.  She was initially seen in consultation at the request of Candice Camp, MD as a hospital follow-up (no note from PCP following ER visit available).  Recent Hospitalizations: No new hospitalizations 1/3-03/2022 -> [facility] Obs - Syncope & Chest Pressure; r/o MI.  She noted transient right-sided numbness with some chest pressure.  She had a previous episode of chest pressure as well.  She noted numbness and pain down the right leg. MRI brain negative.  Carotid ultrasound normal.  Echo normal.  Negative D-dimer. Recommendations: Consider event monitor-patient noted heart rates ranging from 60 to 110 bpm, consider stress test-stressful holiday with some chest pain. Sx not thought to be c/w anxiety  Ferrah K Stogner was seen for initial consultation on December 23, 2021: Multiple complaints.  Somewhat complicated.  Very anxious about symptoms.  Noticed quite stressful holiday season. Initial symptoms began  shortly after Christmas-when she and her family were were down at the beach in [place].  =>She woke up in the morning & had a couple cups of coffee, was otherwise feeling fine.  Then she then got up to get a second And started feeling really lightheaded and woozy.  This was then followed by having some chest pressure which she felt a brick on her chest.  She could not breathe.  It gradually eased off, but was somewhat bothersome to her.   After waiting for several hours in the urgent care center, they decided to drive  home.  On the drive home she started having the chest pressure again and also having pain and numbness on the right side arm and leg with numbness in the leg and pressure.  This lasted all day.  It finally went away and she just felt tired and worn out for the next couple days.   She did feel some irregular heartbeat spells where her heart rate would go fast and then slow to fast again.  She felt forceful beats and skipping beats.  She will get short of breath.  This happened at random times.  A couple days later after Lysle Morales, she again had an episode where she all of a sudden felt lightheaded with chest pressure and tightness.  Previously same symptoms just less severity.  She then again also had a right leg pain again.  He fell again now and achy.  When symptoms did not get better, she decided to go to the emergency room on the third.  She was having chest pressure and tightness that she said may or may not have gotten worse with exertion.  She really cannot tell. Since the ER discharge, she still has had these episodes off and on and has been having this dull aching in the chest off and on.  She said some mild end of day swelling but not significant. Zio patch monitor and Coronary CTA ordered along with lower extremity venous Dopplers Recommended foot elevation and support stockings for calf pain and swelling.  Reviewed  CV studies:    The following studies were reviewed today: (if available, images/films reviewed: From Epic Chart or Care Everywhere) Coronary CTA 01/06/2022: Coronary Calcium Score 0.  No evidence of CAD.  Normal study...  Lower Extremity Venous Doppler 01/03/2022: No evidence of DVT Bilaterally.  No evidence of deep or superficial venous reflux noted bilaterally.  Event monitor January 2023: Essentially normal Predominately sinus rhythm (with bundle branch block) with a rate range of 43 to 135 bpm. Average 65 bpm. Rare isolated PACs and PVCs. Rare PAC couplets and triplets  noted. 2 atrial runs: Fastest 6 beats at 156 bpm, longest 6 beats at 101 bpm. No sustained arrhythmias: Atrial fibrillation, atrial flutter, supraventricular tachycardia or ventricular tachycardia. No notable pauses or significant bradycardia.     Interval History:   Addi K Breisch presents here today overall doing pretty well.  She really has not had that any more of the palpitations or irregular heartbeat sensations.  No more chest pain or pressure or exertional dyspnea.  No further syncope or near syncope.  Although she really notes now is a little left leg numbness.  She indicates that she really had to have any episodes while she was wearing the monitor.  CV Review of Symptoms (Summary) Cardiovascular ROS: positive for - -overall improving fatigue level.  Minimal palpitations.  Most notable symptom is right leg numbness and pain.  negative for -  chest pain, dyspnea on exertion, edema, irregular heartbeat, loss of consciousness, orthopnea, paroxysmal nocturnal dyspnea, rapid heart rate, shortness of breath, or syncope/near syncope or lightheadedness, dizziness, TIA/amaurosis fugax, claudication  REVIEWED OF SYSTEMS   Review of Systems  Constitutional:  Negative for malaise/fatigue (Not really having symptoms of fatigue now.) and weight loss.  HENT:  Negative for congestion and nosebleeds.   Respiratory:  Positive for shortness of breath (No longer having episodes.). Negative for cough.   Cardiovascular:        Per HPI  Gastrointestinal:  Negative for blood in stool, constipation, melena, nausea and vomiting.  Genitourinary:  Negative for dysuria and frequency.  Musculoskeletal:  Negative for joint pain.       Left leg aches mostly from the knee down.  Not really swollen, just tense and numb  Neurological:  Positive for tingling (And numbness-right leg). Negative for dizziness (Minimal), focal weakness (The right leg feels weak), seizures and loss of consciousness.   Psychiatric/Behavioral:  Negative for depression. The patient is nervous/anxious. The patient does not have insomnia (Has not been sleeping well since the episode began).   All other systems reviewed and are negative.  I have reviewed and (if needed) personally updated the patient's problem list, medications, allergies, past medical and surgical history, social and family history.   PAST MEDICAL HISTORY   Past Medical History:  Diagnosis Date   HLD (hyperlipidemia)    Osteoporosis     PAST SURGICAL HISTORY   Past Surgical History:  Procedure Laterality Date   COLONOSCOPY WITH PROPOFOL N/A 06/05/2018   Procedure: COLONOSCOPY WITH PROPOFOL;  Surgeon: Lin Landsman, MD;  Location: [facility] ENDOSCOPY;  Service: Gastroenterology;  Laterality: N/A;   CORONARY CT ANGIOGRAM  01/06/2022   Coronary Calcium Score 0.  No evidence of CAD.  Normal study.   TRANSTHORACIC ECHOCARDIOGRAM  12/15/2020   EF 55 to 60%.  Normal LV function.  No R WMA.  Normal RV size and function.  Unable to assess PAP.  Normal MV.  Normal AoV.  Normal RAP.Marland Kitchen   Zio Patch Event Monitor  12/2021   ESSENTIALLY NORMAL: Predominant sinus rhythm with bundle branch block, rate 43 to 135 bpm.  Average 65 bpm.  Rare PACs and PVCs with rare PAC couplets/triplets.  2 atrial runs: Fastest 6 beats 156 bpm, longest 6 beats 101 bpm.  No sustained arrhythmias.  Notable pauses or bradycardia.    Immunization History  Administered Date(s) Administered   Td 09/23/2009    MEDICATIONS/ALLERGIES   Current Meds  Medication Sig   ezetimibe (ZETIA) 10 MG tablet Take 1 tablet by mouth every evening.   risedronate (ACTONEL) 150 MG tablet Take 150 mg by mouth every 30 (thirty) days.    No Known Allergies  SOCIAL HISTORY/FAMILY HISTORY   Reviewed in Epic:  Pertinent findings:  Social History   Tobacco Use   Smoking status: Never   Smokeless tobacco: Never  Substance Use Topics   Alcohol use: Yes    Alcohol/week: 3.0 - 4.0  standard drinks    Types: 3 - 4 Glasses of wine per week   Drug use: Never   Social History   Social History Narrative   Not on file    OBJCTIVE -PE, EKG, labs   Wt Readings from Last 3 Encounters:  02/10/22 161 lb (73 kg)  12/23/21 158 lb 6 oz (71.8 kg)  12/13/21 160 lb (72.6 kg)    Physical Exam: BP 108/62 (BP Location: Left Arm, Patient Position: Sitting,  Cuff Size: Normal)    Pulse (!) 58    Ht 5\' 3"  (1.6 m)    Wt 161 lb (73 kg)    SpO2 99%    BMI 28.52 kg/m  Physical Exam Vitals reviewed.  Constitutional:      General: She is not in acute distress.    Appearance: Normal appearance. She is normal weight. She is not ill-appearing or toxic-appearing.     Comments: Notably less anxious and pressured today.  HENT:     Head: Normocephalic and atraumatic.  Cardiovascular:     Pulses: Normal pulses.  Pulmonary:     Effort: Pulmonary effort is normal. No respiratory distress.  Musculoskeletal:        General: No swelling.     Cervical back: Normal range of motion and neck supple.  Neurological:     General: No focal deficit present.     Mental Status: She is alert and oriented to person, place, and time.  Psychiatric:        Mood and Affect: Mood normal.        Behavior: Behavior normal.        Thought Content: Thought content normal.        Judgment: Judgment normal.     Adult ECG Report N/a   Recent Labs: Reviewed. Lab Results  Component Value Date   CHOL 188 12/15/2021   HDL 75 12/15/2021   LDLCALC 103 (H) 12/15/2021   TRIG 48 12/15/2021   CHOLHDL 2.5 12/15/2021   Lab Results  Component Value Date   CREATININE 0.75 12/13/2021   BUN 13 12/13/2021   NA 137 12/13/2021   K 3.5 12/13/2021   CL 103 12/13/2021   CO2 26 12/13/2021   CBC Latest Ref Rng & Units 12/13/2021  WBC 4.0 - 10.5 K/uL 6.3  Hemoglobin 12.0 - 15.0 g/dL 12.6  Hematocrit 36.0 - 46.0 % 36.9  Platelets 150 - 400 K/uL 233    Lab Results  Component Value Date   HGBA1C 5.0 12/13/2021    Lab Results  Component Value Date   TSH 1.073 12/13/2021    ==================================================  COVID-19 Education: The signs and symptoms of COVID-19 were discussed with the patient and how to seek care for testing (follow up with PCP or arrange E-visit).    I spent a total of 26 minutes with the patient spent in direct patient consultation.  Additional time spent with chart review  / charting (studies, outside notes, etc): 17 min Total Time: 43 min  Current medicines are reviewed at length with the patient today.  (+/- concerns) none  This visit occurred during the SARS-CoV-2 public health emergency.  Safety protocols were in place, including screening questions prior to the visit, additional usage of staff PPE, and extensive cleaning of exam room while observing appropriate contact time as indicated for disinfecting solutions.  Notice: This dictation was prepared with Dragon dictation along with smart phrase technology. Any transcriptional errors that result from this process are unintentional and may not be corrected upon review.  Studies Ordered:  No orders of the defined types were placed in this encounter.   Patient Instructions / Medication Changes & Studies & Tests Ordered   Patient Instructions  Medication Instructions:  Your physician has recommended you make the following change in your medication:   You can wean yourself off metoprolol. If you do, take 1/2 tablet every day for a week then stop. You can take PRN for palpitations, but if you take it  for more than 5 days, you need to wean off.   *If you need a refill on your cardiac medications before your next appointment, please call your pharmacy*   Lab Work: None ordered  If you have labs (blood work) drawn today and your tests are completely normal, you will receive your results only by: [place] (if you have MyChart) OR A paper copy in the mail If you have any lab test that is  abnormal or we need to change your treatment, we will call you to review the results.   Testing/Procedures: None ordered   Follow-Up: At [facility], you and your health needs are our priority.  As part of our continuing mission to provide you with exceptional heart care, we have created designated Provider Care Teams.  These Care Teams include your primary Cardiologist (physician) and Advanced Practice Providers (APPs -  Physician Assistants and Nurse Practitioners) who all work together to provide you with the care you need, when you need it.  We recommend signing up for the patient portal called "MyChart".  Sign up information is provided on this After Visit Summary.  MyChart is used to connect with patients for Virtual Visits (Telemedicine).  Patients are able to view lab/test results, encounter notes, upcoming appointments, etc.  Non-urgent messages can be sent to your provider as well.   To learn more about what you can do with MyChart, go to [email or web address].    Your next appointment:   Your physician wants you to follow-up in: 1 year. You will receive a reminder letter in the mail two months in advance. If you don't receive a letter, please call our office to schedule the follow-up appointment.   The format for your next appointment:   In Person  Provider:   You may see Glenetta Hew, MD or one of the following Advanced Practice Providers on your designated Care Team:   Murray Hodgkins, NP Christell Faith, PA-C Cadence Kathlen Mody, [place]    Other Instructions N/A      Glenetta Hew, M.D., M.S. Interventional Cardiologist   Pager # [phone] Phone # [phone] [address] [place], [place] [address]   Thank you for choosing [facility] [place]!!

## 2022-02-10 NOTE — Assessment & Plan Note (Signed)
Coronary CTA essentially normal.  Calcium score 0 with no evidence of CAD noted.  Not likely to be cardiac etiology for chest pain.  Suspect that it was probably anxiety driven.   ?

## 2022-02-10 NOTE — Assessment & Plan Note (Signed)
Mostly right sided leg numbness.  Not likely related to venous stasis answered Dopplers did not show any significant venous stasis.  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  Not consistent with claudication or stasis related neuropathy. ?

## 2022-02-10 NOTE — Assessment & Plan Note (Signed)
Restratification Coronary CTA you shows that she is relatively low risk with a Coronary Calcium Score 0 and no evidence of CAD.  I think she is fine with her current level of lipids and does not necessarily need to be on statin. ?

## 2022-02-10 NOTE — Patient Instructions (Signed)
Medication Instructions:  ?Your physician has recommended you make the following change in your medication:  ? ?You can wean yourself off metoprolol. If you do, take 1/2 tablet every day for a week then stop. You can take PRN for palpitations, but if you take it for more than 5 days, you need to wean off.  ? ?*If you need a refill on your cardiac medications before your next appointment, please call your pharmacy* ? ? ?Lab Work: ?None ordered ? ?If you have labs (blood work) drawn today and your tests are completely normal, you will receive your results only by: ?MyChart Message (if you have MyChart) OR ?A paper copy in the mail ?If you have any lab test that is abnormal or we need to change your treatment, we will call you to review the results. ? ? ?Testing/Procedures: ?None ordered ? ? ?Follow-Up: ?At [facility], you and your health needs are our priority.  As part of our continuing mission to provide you with exceptional heart care, we have created designated Provider Care Teams.  These Care Teams include your primary Cardiologist (physician) and Advanced Practice Providers (APPs -  Physician Assistants and Nurse Practitioners) who all work together to provide you with the care you need, when you need it. ? ?We recommend signing up for the patient portal called "MyChart".  Sign up information is provided on this After Visit Summary.  MyChart is used to connect with patients for Virtual Visits (Telemedicine).  Patients are able to view lab/test results, encounter notes, upcoming appointments, etc.  Non-urgent messages can be sent to your provider as well.   ?To learn more about what you can do with MyChart, go to [email or web address].   ? ?Your next appointment:   ?Your physician wants you to follow-up in: 1 year. You will receive a reminder letter in the mail two months in advance. If you don't receive a letter, please call our office to schedule the follow-up appointment.  ? ?The format for your next  appointment:   ?In Person ? ?Provider:   ?You may see Glenetta Hew, MD or one of the following Advanced Practice Providers on your designated Care Team:   ?Murray Hodgkins, NP ?Christell Faith, PA-C ?Cadence Kathlen Mody, PA-C  ? ? ?Other Instructions ?N/A ?

## 2022-07-11 ENCOUNTER — Ambulatory Visit: Admit: 2022-07-11 | Discharge status: Absent without leave | Payer: BC Managed Care – PPO

## 2022-07-11 ENCOUNTER — Ambulatory Visit
Admission: EM | Admit: 2022-07-11 | Discharge: 2022-07-11 | Disposition: A | Discharge status: Home / Self Care | Payer: BC Managed Care – PPO | Attending: Emergency Medicine | Admitting: Emergency Medicine

## 2022-07-11 VITALS — BP 121/76 | HR 69 | Temp 98.8°F | Resp 18

## 2022-07-11 DIAGNOSIS — J0100 Acute maxillary sinusitis, unspecified: Secondary | ICD-10-CM | POA: Diagnosis not present

## 2022-07-11 IMAGING — CT CT HEART MORP W/ CTA COR W/ SCORE W/ CA W/CM &/OR W/O CM
2 of 11 series · 7 of 20 positions shown, 8 images · non-contrast
Comparison: None.

Addendum:
CLINICAL DATA: Chest pain

EXAM:
Cardiac/Coronary  CTA
TECHNIQUE: The patient was scanned on a Siemens Somatom go.Top scanner.

[Series 26: multiphase % cta coronary 0.60 · axial · 0.32mm/px · z∈[-1103,-1028]mm · 4 of 3432 slices shown, 5 images]
[im 687/3432  vessel]
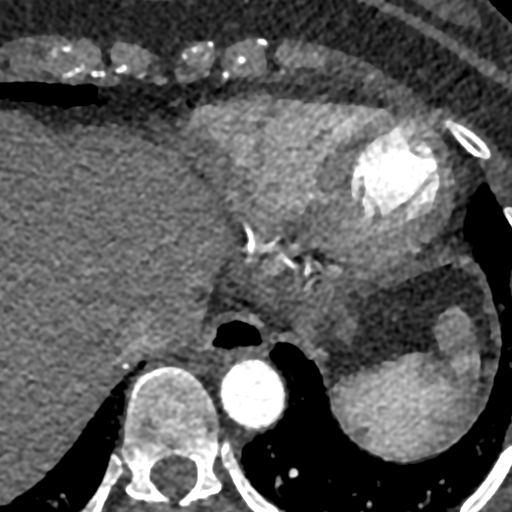
[im 687/3432  lung]
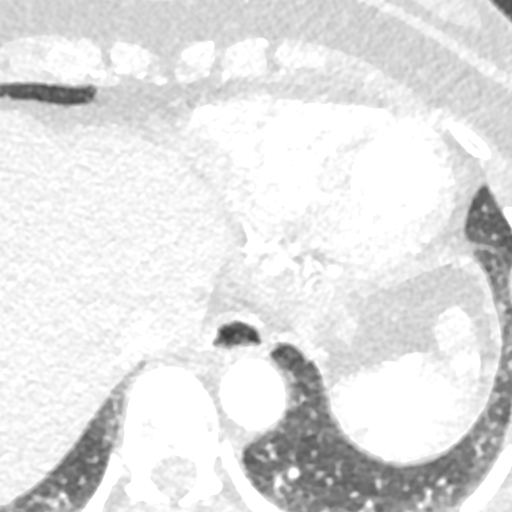
[im 1373/3432  vessel]
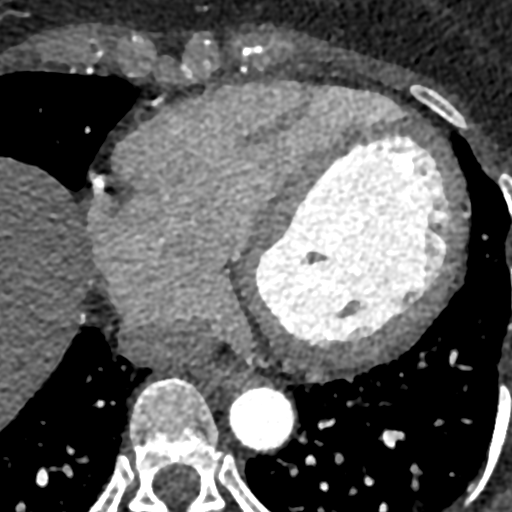
[im 2059/3432  vessel]
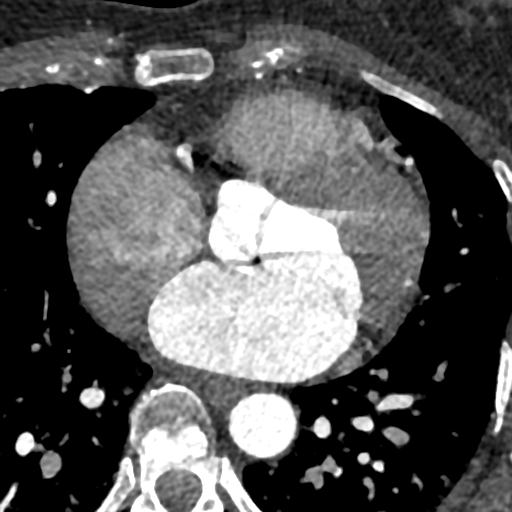
[im 2745/3432  vessel]
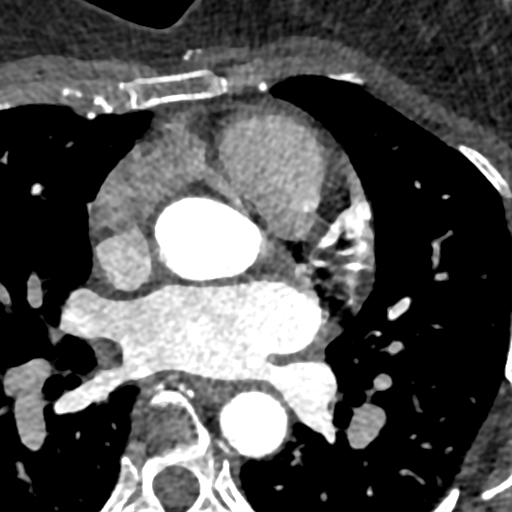

[Series 34: ms multiphase cta coronary 0.60 · axial · 0.32mm/px · z∈[-1097,-1034]mm · 3 of 2808 slices shown]
[im 702/2808  vessel]
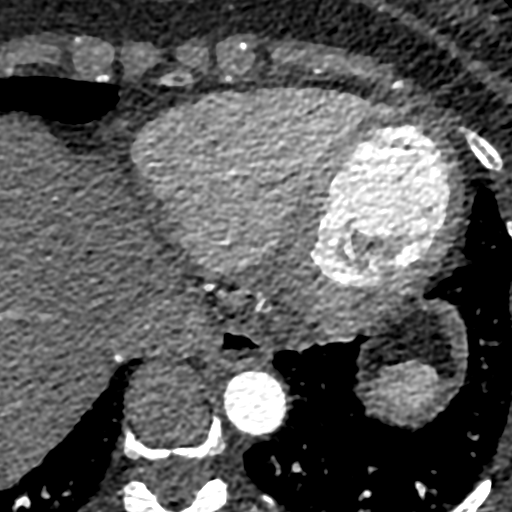
[im 1404/2808  vessel]
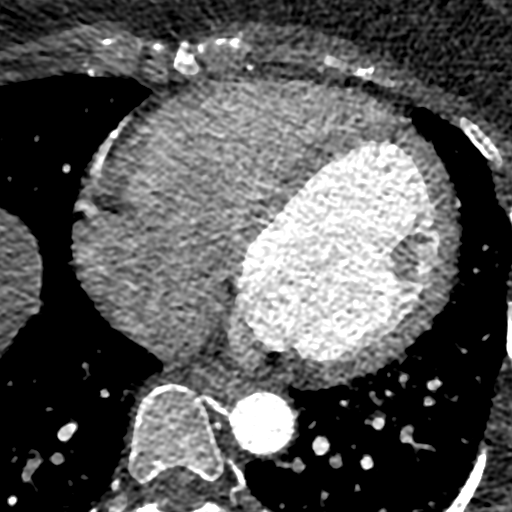
[im 2106/2808  vessel]
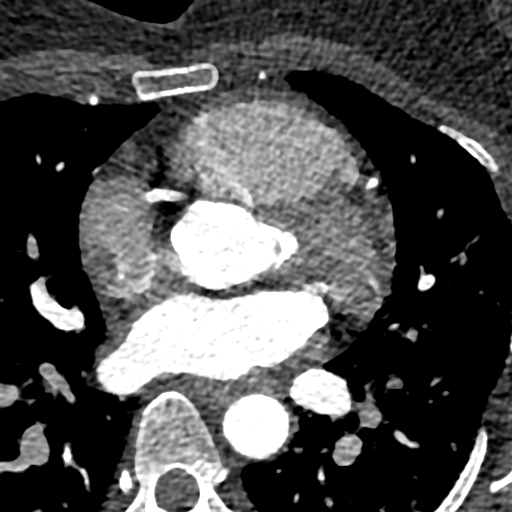

[7 of 20 positions shown; findings below may reference images not displayed]

:
A retrospective scan was triggered in the descending thoracic aorta.
Axial non-contrast 3 mm slices were carried out through the heart.
The data set was analyzed on a dedicated work station and scored
using the Agatson method. Gantry rotation speed was 330 msecs and
collimation was .6 mm. 25mg of metoprolol and 0.8 mg of sl NTG was
given. The 3D data set was reconstructed in 5% intervals of the
60-95 % of the R-R cycle. Diastolic phases were analyzed on a
dedicated work station using MPR, MIP and VRT modes. The patient
received 75 cc of contrast.
FINDINGS: Aorta:  Normal size.  No calcifications.  No dissection.

Aortic Valve:  Trileaflet.  No calcifications.

Coronary Arteries:  Normal coronary origin.  Right dominance.

RCA is a dominant artery that gives rise to PDA and PLA. There is no
plaque.

Left main is a large artery that gives rise to LAD and LCX arteries.
LM has no plaque.

LAD has no plaque.

LCX is a small non-dominant artery.  There is no plaque.

Other findings:

Normal pulmonary vein drainage into the left atrium.

Normal left atrial appendage without a thrombus.

Normal size of the pulmonary artery.
IMPRESSION: 1. Normal coronary calcium score of 0. Patient is low risk for
coronary events.

2. Normal coronary origin with right dominance.

3. No evidence of CAD.

4. CAD-RADS 0. Consider non-atherosclerotic causes of chest pain.

EXAM:
OVER-READ INTERPRETATION  CT CHEST

The following report is an over-read performed by radiologist Dr.
over-read does not include interpretation of cardiac or coronary
anatomy or pathology. The calcium score interpretation by the
cardiologist is attached.
FINDINGS: Limited view of the lung parenchyma demonstrates no suspicious
nodularity. Airways are normal.

Limited view of the mediastinum demonstrates no adenopathy.
Esophagus normal.

Limited view of the upper abdomen unremarkable.

Limited view of the skeleton and chest wall is unremarkable.
IMPRESSION: No significant extracardiac findings.

*** End of Addendum ***
:
A retrospective scan was triggered in the descending thoracic aorta.
Axial non-contrast 3 mm slices were carried out through the heart.
The data set was analyzed on a dedicated work station and scored
using the Agatson method. Gantry rotation speed was 330 msecs and
collimation was .6 mm. 25mg of metoprolol and 0.8 mg of sl NTG was
given. The 3D data set was reconstructed in 5% intervals of the
60-95 % of the R-R cycle. Diastolic phases were analyzed on a
dedicated work station using MPR, MIP and VRT modes. The patient
received 75 cc of contrast.
FINDINGS: Aorta:  Normal size.  No calcifications.  No dissection.

Aortic Valve:  Trileaflet.  No calcifications.

Coronary Arteries:  Normal coronary origin.  Right dominance.

RCA is a dominant artery that gives rise to PDA and PLA. There is no
plaque.

Left main is a large artery that gives rise to LAD and LCX arteries.
LM has no plaque.

LAD has no plaque.

LCX is a small non-dominant artery.  There is no plaque.

Other findings:

Normal pulmonary vein drainage into the left atrium.

Normal left atrial appendage without a thrombus.

Normal size of the pulmonary artery.
IMPRESSION: 1. Normal coronary calcium score of 0. Patient is low risk for
coronary events.

2. Normal coronary origin with right dominance.

3. No evidence of CAD.

4. CAD-RADS 0. Consider non-atherosclerotic causes of chest pain.

## 2022-07-11 MED ORDER — AMOXICILLIN-POT CLAVULANATE 875-125 MG PO TABS: 1.0000 | ORAL_TABLET | Freq: Two times a day (BID) | ORAL | 0 refills | Status: AC

## 2022-07-11 MED ORDER — POLYMYXIN B-TRIMETHOPRIM 10000-0.1 UNIT/ML-% OP SOLN: 1.0000 [drp] | OPHTHALMIC | 0 refills | Status: AC

## 2022-07-11 NOTE — ED Provider Notes (Signed)
Sherry Bradshaw    CSN: [id number] Arrival date & time: 07/11/22  1248      History   Chief Complaint Chief Complaint  Patient presents with   Nasal Congestion    1:00 APPT    Facial Pain    HPI Sherry Bradshaw is a 56 y.o. female.   Patient presents with nasal congestion rhinorrhea, sinus pain and pressure, itchy sore throat, bilateral ear fullness for 7 days.  Began to have right eye erythema and drainage with crusting beginning 1 day ago.  Known sick contact in household.  Tolerating food and liquids.  Has attempted use of Aleve, Tylenol saline irrigation and over-the-counter cold and flu mix's which has been moderately helpful.  No pertinent medical history.  Past Medical History:  Diagnosis Date   HLD (hyperlipidemia)    Osteoporosis     Patient Active Problem List   Diagnosis Date Noted   Heart palpitations 12/23/2021   Bilateral calf pain 12/23/2021   Numbness on right side    Chest pain with moderate risk for cardiac etiology    Near syncope 12/14/2021   Chest pressure 12/14/2021   HLD (hyperlipidemia) 12/14/2021   Encounter for screening colonoscopy    OTITIS EXTERNA, ACUTE 02/03/2010    Past Surgical History:  Procedure Laterality Date   COLONOSCOPY WITH PROPOFOL N/A 06/05/2018   Procedure: COLONOSCOPY WITH PROPOFOL;  Surgeon: Toney Reil, MD;  Location: [facility] ENDOSCOPY;  Service: Gastroenterology;  Laterality: N/A;   CORONARY CT ANGIOGRAM  01/06/2022   Coronary Calcium Score 0.  No evidence of CAD.  Normal study.   TRANSTHORACIC ECHOCARDIOGRAM  12/15/2020   EF 55 to 60%.  Normal LV function.  No R WMA.  Normal RV size and function.  Unable to assess PAP.  Normal MV.  Normal AoV.  Normal RAP.Sherry Bradshaw   Zio Patch Event Monitor  12/2021   ESSENTIALLY NORMAL: Predominant sinus rhythm with bundle branch block, rate 43 to 135 bpm.  Average 65 bpm.  Rare PACs and PVCs with rare PAC couplets/triplets.  2 atrial runs: Fastest 6 beats 156 bpm, longest 6  beats 101 bpm.  No sustained arrhythmias.  Notable pauses or bradycardia.    OB History   No obstetric history on file.      Home Medications    Prior to Admission medications   Medication Sig Start Date End Date Taking? Authorizing Provider  amoxicillin-clavulanate (AUGMENTIN) 875-125 MG tablet Take 1 tablet by mouth 2 (two) times daily for 10 days. 07/11/22 07/21/22 Yes Salah Nakamura R, NP  ezetimibe (ZETIA) 10 MG tablet Take 1 tablet by mouth every evening. 12/29/21  Yes [provider]  nitroGLYCERIN (NITROSTAT) 0.3 MG SL tablet Place under the tongue. 12/21/21  Yes [provider]  trimethoprim-polymyxin b (POLYTRIM) ophthalmic solution Place 1 drop into the right eye every 4 (four) hours. 07/11/22  Yes Jairo Bellew R, NP  risedronate (ACTONEL) 150 MG tablet Take 150 mg by mouth every 30 (thirty) days. 10/10/21   [provider]    Family History Family History  Problem Relation Age of Onset   Valvular heart disease Mother    Heart disease Maternal Grandmother     Social History Social History   Tobacco Use   Smoking status: Never   Smokeless tobacco: Never  Substance Use Topics   Alcohol use: Yes    Alcohol/week: 3.0 - 4.0 standard drinks of alcohol    Types: 3 - 4 Glasses of wine per week  Comment: occ   Drug use: Never     Allergies   Patient has no known allergies.   Review of Systems Review of Systems Defer to HPI    Physical Exam Triage Vital Signs ED Triage Vitals  Enc Vitals Group     BP 07/11/22 1321 121/76     Pulse Rate 07/11/22 1321 69     Resp 07/11/22 1321 18     Temp 07/11/22 1321 98.8 F (37.1 C)     Temp Source 07/11/22 1321 Oral     SpO2 07/11/22 1321 95 %     Weight --      Height --      Head Circumference --      Peak Flow --      Pain Score 07/11/22 1326 4     Pain Loc --      Pain Edu? --      Excl. in GC? --    No data found.  Updated Vital Signs BP 121/76 (BP Location: Left Arm)    Pulse 69   Temp 98.8 F (37.1 C) (Oral)   Resp 18   SpO2 95%   Visual Acuity Right Eye Distance:   Left Eye Distance:   Bilateral Distance:    Right Eye Near:   Left Eye Near:    Bilateral Near:     Physical Exam Constitutional:      Appearance: Normal appearance.  HENT:     Head: Normocephalic.     Right Ear: Tympanic membrane, ear canal and external ear normal.     Left Ear: Tympanic membrane, ear canal and external ear normal.     Nose: Congestion present. No rhinorrhea.     Mouth/Throat:     Mouth: Mucous membranes are moist.     Pharynx: Posterior oropharyngeal erythema present.  Eyes:     Comments: Erythema is noted to the right conjunctiva, no drainage or swelling present, vision is grossly intact with extra movements intact  Cardiovascular:     Rate and Rhythm: Normal rate and regular rhythm.     Pulses: Normal pulses.     Heart sounds: Normal heart sounds.  Pulmonary:     Effort: Pulmonary effort is normal.     Breath sounds: Normal breath sounds.  Musculoskeletal:     Cervical back: Normal range of motion and neck supple.  Skin:    General: Skin is warm and dry.  Neurological:     Mental Status: She is alert and oriented to person, place, and time. Mental status is at baseline.  Psychiatric:        Mood and Affect: Mood normal.        Behavior: Behavior normal.      UC Treatments / Results  Labs (all labs ordered are listed, but only abnormal results are displayed) Labs Reviewed - No data to display  EKG   Radiology No results found.  Procedures Procedures (including critical care time)  Medications Ordered in UC Medications - No data to display  Initial Impression / Assessment and Plan / UC Course  I have reviewed the triage vital signs and the nursing notes.  Pertinent labs & imaging results that were available during my care of the patient were reviewed by me and considered in my medical decision making (see chart for details).  Acute  nonrecurrent maxillary sinusitis, acute bacterial conjunctivitis of right eye  Presentation is consistent with sinusitis and conjunctivitis, discussed with patient, as symptoms have been present for  7 days without signs of resolution we will provide bacterial coverage, Augmentin 10-day course prescribed as well as Polytrim, discussed administration, may continue use of over-the-counter medications for supportive care with urgent care follow-up as needed Final Clinical Impressions(s) / UC Diagnoses   Final diagnoses:  Acute non-recurrent maxillary sinusitis     Discharge Instructions      Today you are being treated for a sinus infection as well as bacterial conjunctivitis  Take Augmentin every morning and every evening for the next 10 days, ideally you will begin to see improvement in about 48 hours and steady progression from there  You may continue all of the over-the-counter medications that you have been using for supportive care in addition to your antibiotic  Place one drop of polytrim into the effected eye every 4 hours while awake for 7 days. If the other eye starts to have symptoms you may use medication in it as well. Do not allow tip of dropper to touch eye.  May use cool compress for comfort and to remove discharge if present. Pat the eye, do not wipe.  If wearing contacts, dispose of current pair. Wear glasses until symptoms have resolved.   Do not rub eyes, this may cause more irritation.  May use benadryl as needed to help if itching present.  Please avoid use of eye makeup until symptoms clear.  If symptoms persist after use of medication, please follow up at Urgent Care or with ophthalmologist (eye doctor)    ED Prescriptions     Medication Sig Dispense Auth. Provider   amoxicillin-clavulanate (AUGMENTIN) 875-125 MG tablet Take 1 tablet by mouth 2 (two) times daily for 10 days. 20 tablet Amarachi Kotz R, NP   trimethoprim-polymyxin b (POLYTRIM) ophthalmic  solution Place 1 drop into the right eye every 4 (four) hours. 10 mL Valinda Hoar, NP      PDMP not reviewed this encounter.   Valinda Hoar, NP 07/11/22 1401

## 2022-07-11 NOTE — ED Triage Notes (Signed)
Pt c/o sinus pressure worsening x 1 week.  Feels pressure all across her head.  Unsure of fever. Pt also has redness in R eye starting yesterday and states it was crusted over this morning. Took Advil at 0900. Also taking saline rinses, Tylenol, OTC cough and allergy meds with no relief.

## 2022-07-11 NOTE — Discharge Instructions (Signed)
Today you are being treated for a sinus infection as well as bacterial conjunctivitis  Take Augmentin every morning and every evening for the next 10 days, ideally you will begin to see improvement in about 48 hours and steady progression from there  You may continue all of the over-the-counter medications that you have been using for supportive care in addition to your antibiotic  Place one drop of polytrim into the effected eye every 4 hours while awake for 7 days. If the other eye starts to have symptoms you may use medication in it as well. Do not allow tip of dropper to touch eye.  May use cool compress for comfort and to remove discharge if present. Pat the eye, do not wipe.  If wearing contacts, dispose of current pair. Wear glasses until symptoms have resolved.   Do not rub eyes, this may cause more irritation.  May use benadryl as needed to help if itching present.  Please avoid use of eye makeup until symptoms clear.  If symptoms persist after use of medication, please follow up at Urgent Care or with ophthalmologist (eye doctor)

## 2022-10-13 ENCOUNTER — Ambulatory Visit
Admission: RE | Admit: 2022-10-13 | Discharge: 2022-10-13 | Disposition: A | Discharge status: Home / Self Care | Payer: BC Managed Care – PPO | Source: Ambulatory Visit | Attending: Family Medicine | Admitting: Family Medicine

## 2022-10-13 VITALS — BP 112/73 | HR 84 | Temp 98.0°F | Resp 15

## 2022-10-13 DIAGNOSIS — U071 COVID-19: Secondary | ICD-10-CM

## 2022-10-13 NOTE — Discharge Instructions (Addendum)
Follow up here or with your primary care provider if your symptoms are worsening or not improving.     

## 2022-10-13 NOTE — ED Provider Notes (Signed)
Roderic Palau    CSN: [id number] Arrival date & time: 10/13/22  0931      History   Chief Complaint Chief Complaint  Patient presents with   Cough    Home test positive for Covid. Sinus pressure and dry cough. - Entered by patient   Facial Pain    HPI Balinda K Jovel is a 56 y.o. female.    Cough   Presents to UC after COVID positive test at home.  She endorses cough and sinus pressure starting 3 days ago.  She uses over-the-counter medication for symptom control including Tylenol and Sudafed sinus.  She states these medications have controlled her symptoms adequately.  Cough keeps her awake at night.  She has not used any OTC nighttime cough medication.  Past Medical History:  Diagnosis Date   HLD (hyperlipidemia)    Osteoporosis     Patient Active Problem List   Diagnosis Date Noted   Heart palpitations 12/23/2021   Bilateral calf pain 12/23/2021   Numbness on right side    Chest pain with moderate risk for cardiac etiology    Near syncope 12/14/2021   Chest pressure 12/14/2021   HLD (hyperlipidemia) 12/14/2021   Encounter for screening colonoscopy    OTITIS EXTERNA, ACUTE 02/03/2010    Past Surgical History:  Procedure Laterality Date   COLONOSCOPY WITH PROPOFOL N/A 06/05/2018   Procedure: COLONOSCOPY WITH PROPOFOL;  Surgeon: Lin Landsman, MD;  Location: [place];  Service: Gastroenterology;  Laterality: N/A;   CORONARY CT ANGIOGRAM  01/06/2022   Coronary Calcium Score 0.  No evidence of CAD.  Normal study.   TRANSTHORACIC ECHOCARDIOGRAM  12/15/2020   EF 55 to 60%.  Normal LV function.  No R WMA.  Normal RV size and function.  Unable to assess PAP.  Normal MV.  Normal AoV.  Normal RAP.Marland Kitchen   Zio Patch Event Monitor  12/2021   ESSENTIALLY NORMAL: Predominant sinus rhythm with bundle branch block, rate 43 to 135 bpm.  Average 65 bpm.  Rare PACs and PVCs with rare PAC couplets/triplets.  2 atrial runs: Fastest 6 beats 156 bpm, longest 6  beats 101 bpm.  No sustained arrhythmias.  Notable pauses or bradycardia.    OB History   No obstetric history on file.      Home Medications    Prior to Admission medications   Medication Sig Start Date End Date Taking? Authorizing Provider  ezetimibe (ZETIA) 10 MG tablet Take 1 tablet by mouth every evening. 12/29/21   [provider]  nitroGLYCERIN (NITROSTAT) 0.3 MG SL tablet Place under the tongue. 12/21/21   [provider]  risedronate (ACTONEL) 150 MG tablet Take 150 mg by mouth every 30 (thirty) days. 10/10/21   [provider]  trimethoprim-polymyxin b (POLYTRIM) ophthalmic solution Place 1 drop into the right eye every 4 (four) hours. 07/11/22   Hans Eden, NP    Family History Family History  Problem Relation Age of Onset   Valvular heart disease Mother    Heart disease Maternal Grandmother     Social History Social History   Tobacco Use   Smoking status: Never   Smokeless tobacco: Never  Substance Use Topics   Alcohol use: Yes    Alcohol/week: 3.0 - 4.0 standard drinks of alcohol    Types: 3 - 4 Glasses of wine per week    Comment: occ   Drug use: Never     Allergies   Patient has no known allergies.  Review of Systems Review of Systems  Respiratory:  Positive for cough.      Physical Exam Triage Vital Signs ED Triage Vitals [10/13/22 0940]  Enc Vitals Group     BP 112/73     Pulse Rate 84     Resp 15     Temp 98 F (36.7 C)     Temp src      SpO2 96 %     Weight      Height      Head Circumference      Peak Flow      Pain Score 0     Pain Loc      Pain Edu?      Excl. in GC?    No data found.  Updated Vital Signs BP 112/73   Pulse 84   Temp 98 F (36.7 C)   Resp 15   SpO2 96%   Visual Acuity Right Eye Distance:   Left Eye Distance:   Bilateral Distance:    Right Eye Near:   Left Eye Near:    Bilateral Near:     Physical Exam Vitals reviewed.  Constitutional:      Appearance:  Normal appearance.  Cardiovascular:     Rate and Rhythm: Normal rate and regular rhythm.     Pulses: Normal pulses.     Heart sounds: Normal heart sounds.  Pulmonary:     Effort: Pulmonary effort is normal.     Breath sounds: Normal breath sounds.  Skin:    General: Skin is warm and dry.  Neurological:     General: No focal deficit present.     Mental Status: She is alert and oriented to person, place, and time.  Psychiatric:        Mood and Affect: Mood normal.        Behavior: Behavior normal.      UC Treatments / Results  Labs (all labs ordered are listed, but only abnormal results are displayed) Labs Reviewed - No data to display  EKG   Radiology No results found.  Procedures Procedures (including critical care time)  Medications Ordered in UC Medications - No data to display  Initial Impression / Assessment and Plan / UC Course  I have reviewed the triage vital signs and the nursing notes.  Pertinent labs & imaging results that were available during my care of the patient were reviewed by me and considered in my medical decision making (see chart for details).   COVID-positive at home and likely accurate.  Patient is in general good health, with relatively mild symptoms.  As a result I did not recommend antiviral treatment.  Discussed treatment for symptom control including Tussionex for cough and prednisone for sinus pressure.  Patient agreed to continue to use OTC medication.  She will return if symptoms worsen.   Final Clinical Impressions(s) / UC Diagnoses   Final diagnoses:  None   Discharge Instructions   None    ED Prescriptions   None    PDMP not reviewed this encounter.   Charma Igo, [place] 10/13/22 [phone]

## 2022-10-13 NOTE — ED Triage Notes (Signed)
Pt. Presents to UC COVID positive after taking a home test. She also states she is not sure if it was accurate d/t the test being expired.Pt. C/o a cough and sinus pressure that started 3 days ago. Pt. Expresses concern for sinus infection as well.
